# Patient Record
Sex: Female | Born: 1960 | Race: White | Hispanic: No | Marital: Married | Smoking: Current every day smoker
Health system: Southern US, Community
[De-identification: ages and names within clinical notes are randomized; demographics above are authoritative.]

## PROBLEM LIST (undated history)

## (undated) DIAGNOSIS — J449 Chronic obstructive pulmonary disease, unspecified: Secondary | ICD-10-CM

## (undated) HISTORY — PX: HERNIA REPAIR: SHX51

---

## 2001-09-21 ENCOUNTER — Other Ambulatory Visit: Admission: RE | Admit: 2001-09-21 | Discharge: 2001-09-21 | Payer: Self-pay | Admitting: Obstetrics and Gynecology

## 2004-08-01 ENCOUNTER — Ambulatory Visit (HOSPITAL_BASED_OUTPATIENT_CLINIC_OR_DEPARTMENT_OTHER): Admission: RE | Admit: 2004-08-01 | Discharge: 2004-08-01 | Payer: Self-pay | Admitting: Surgery

## 2004-10-24 ENCOUNTER — Ambulatory Visit: Payer: Self-pay

## 2006-01-15 ENCOUNTER — Emergency Department: Payer: Self-pay | Admitting: Emergency Medicine

## 2008-03-10 ENCOUNTER — Ambulatory Visit: Payer: Self-pay

## 2012-08-05 ENCOUNTER — Ambulatory Visit: Payer: Self-pay | Admitting: Family Medicine

## 2014-04-19 IMAGING — MG MM CAD SCREENING MAMMO
1 series · 4 of 4 positions shown · non-contrast
Comparison: none

REASON FOR EXAM: SCR MAMMO NO ORDER
COMMENTS:

PROCEDURE:     MAM - MAM DGTL SCRN MAM NO ORDER W/CAD  - August 05, 2012  [DATE]
RESULT:     COMPARISON:  03/10/2008, 10/24/2004
TECHNIQUE: Digital screening mammograms were obtained. FDA approved
computer-aided detection (CAD) for mammography was utilized for this study.
BREAST COMPOSITION: The breast composition is HETEROGENEOUSLY DENSE
(glandular tissue is 51-75%). This may decrease the sensitivity of
mammography.

[R CC · right · 4 of 4 slices shown]
[im 1/4]
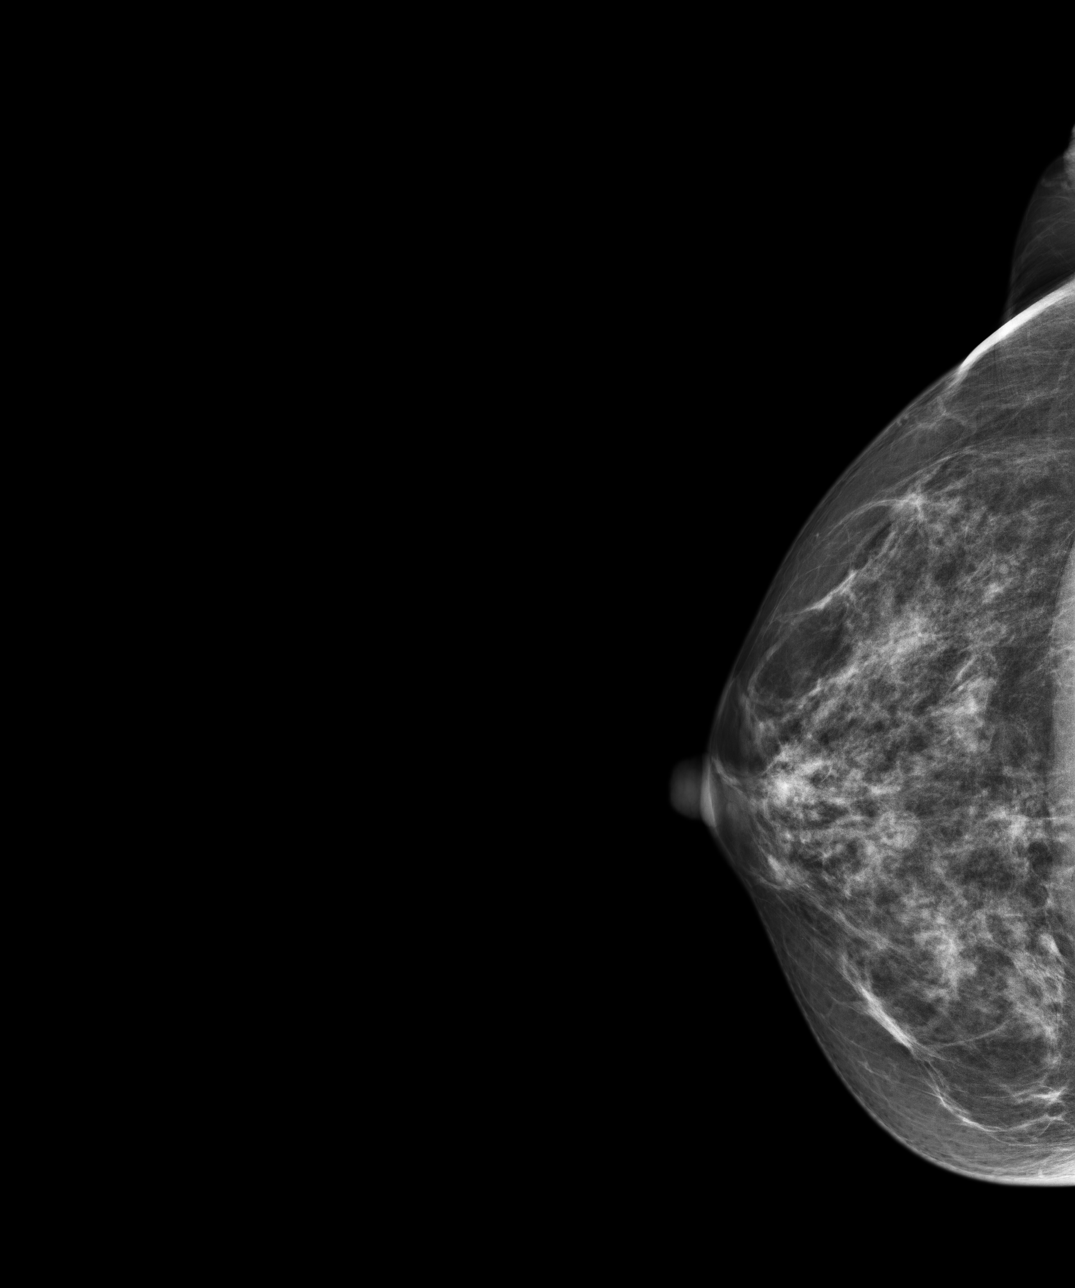
[im 2/4]
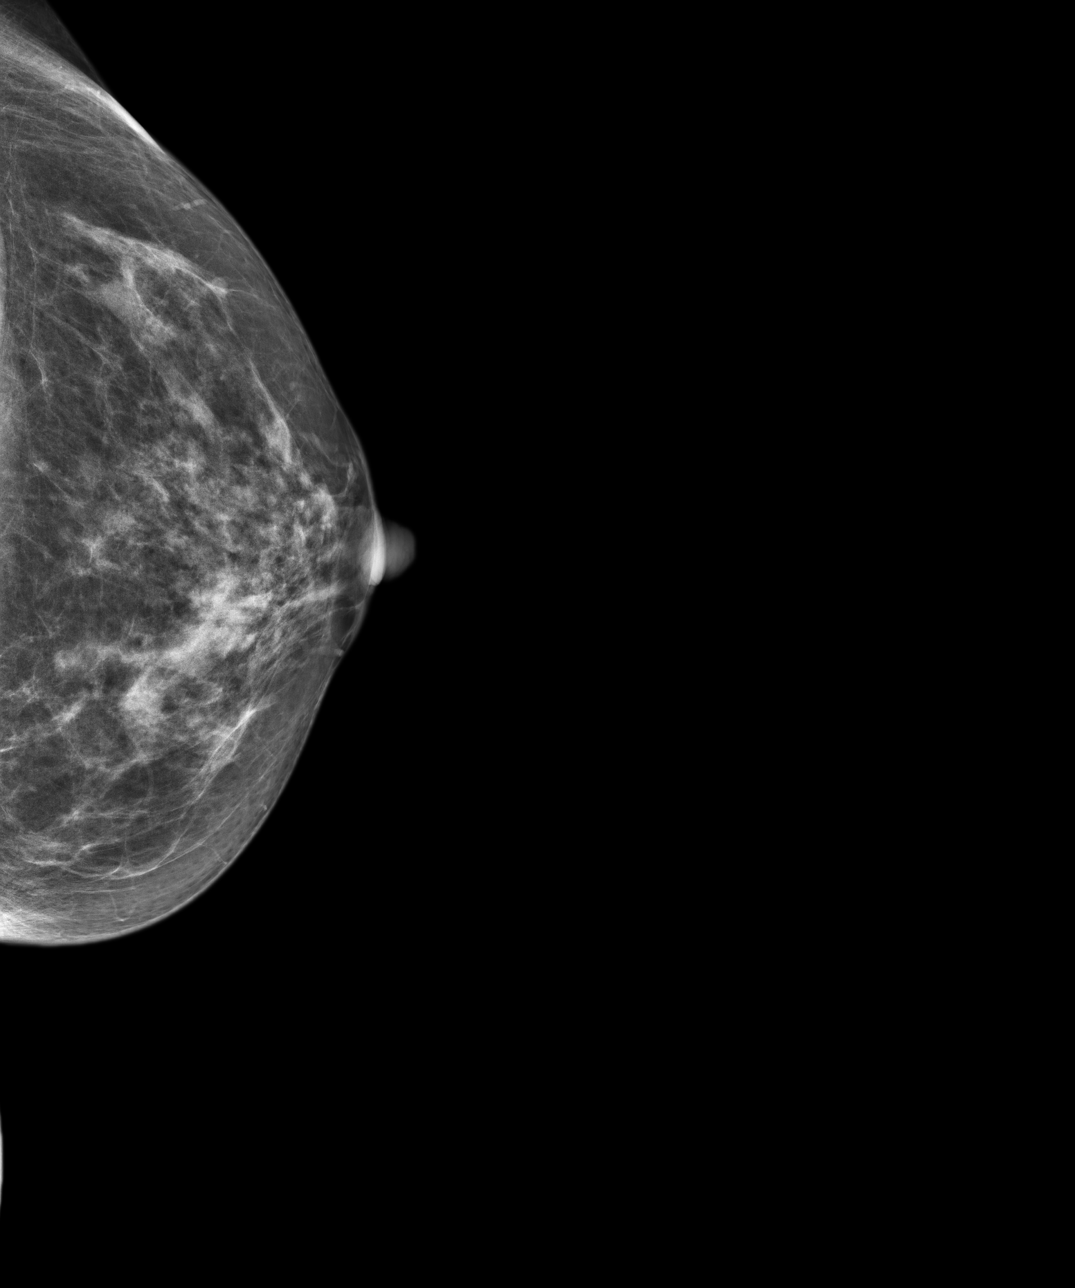
[im 3/4]
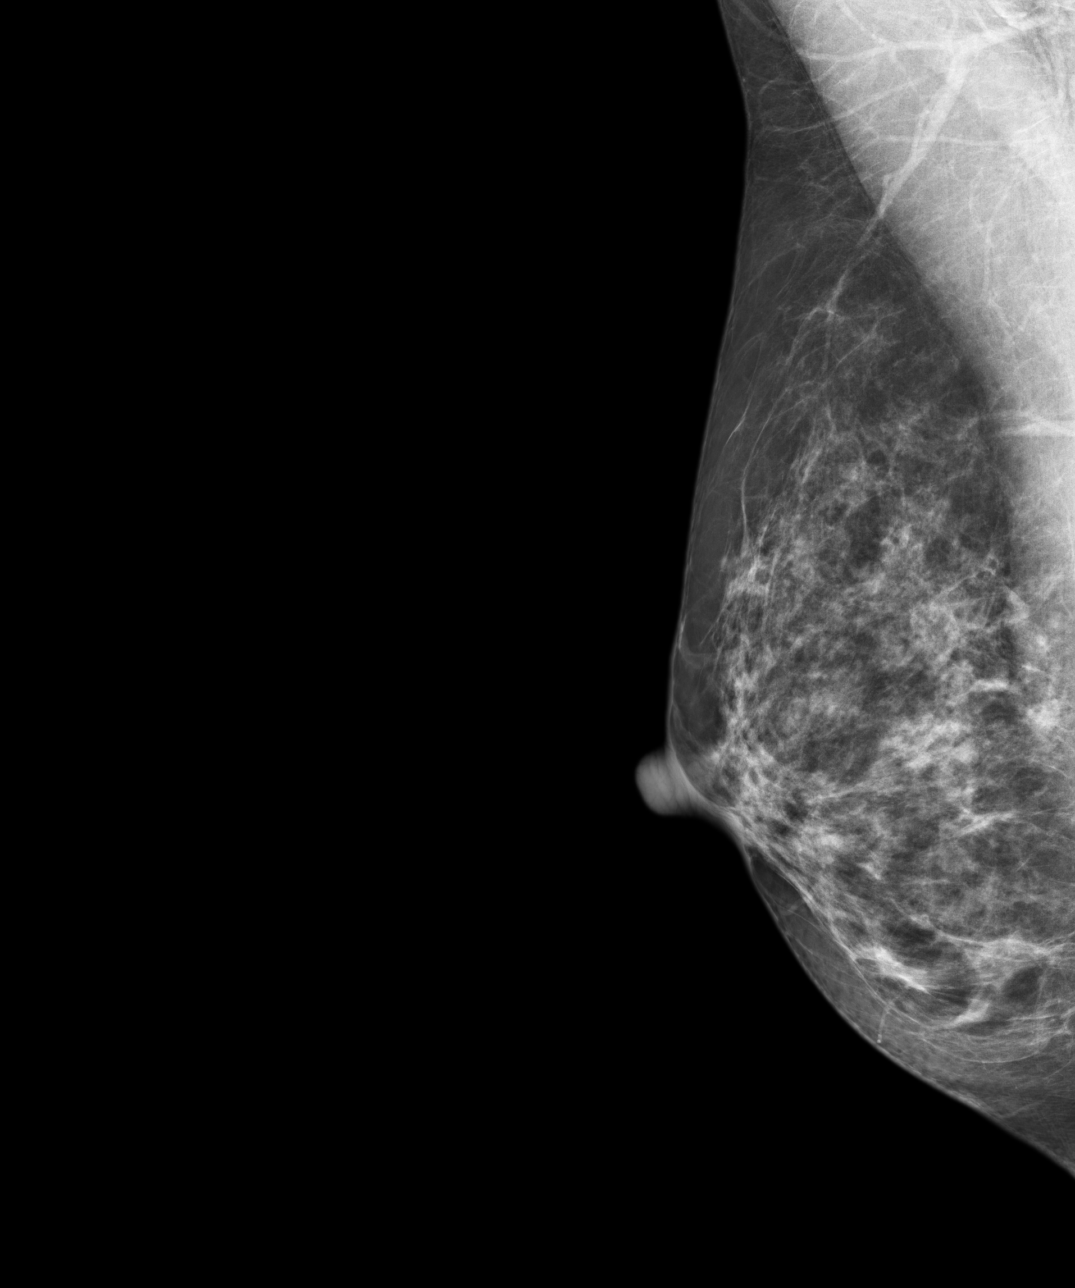
[im 4/4]
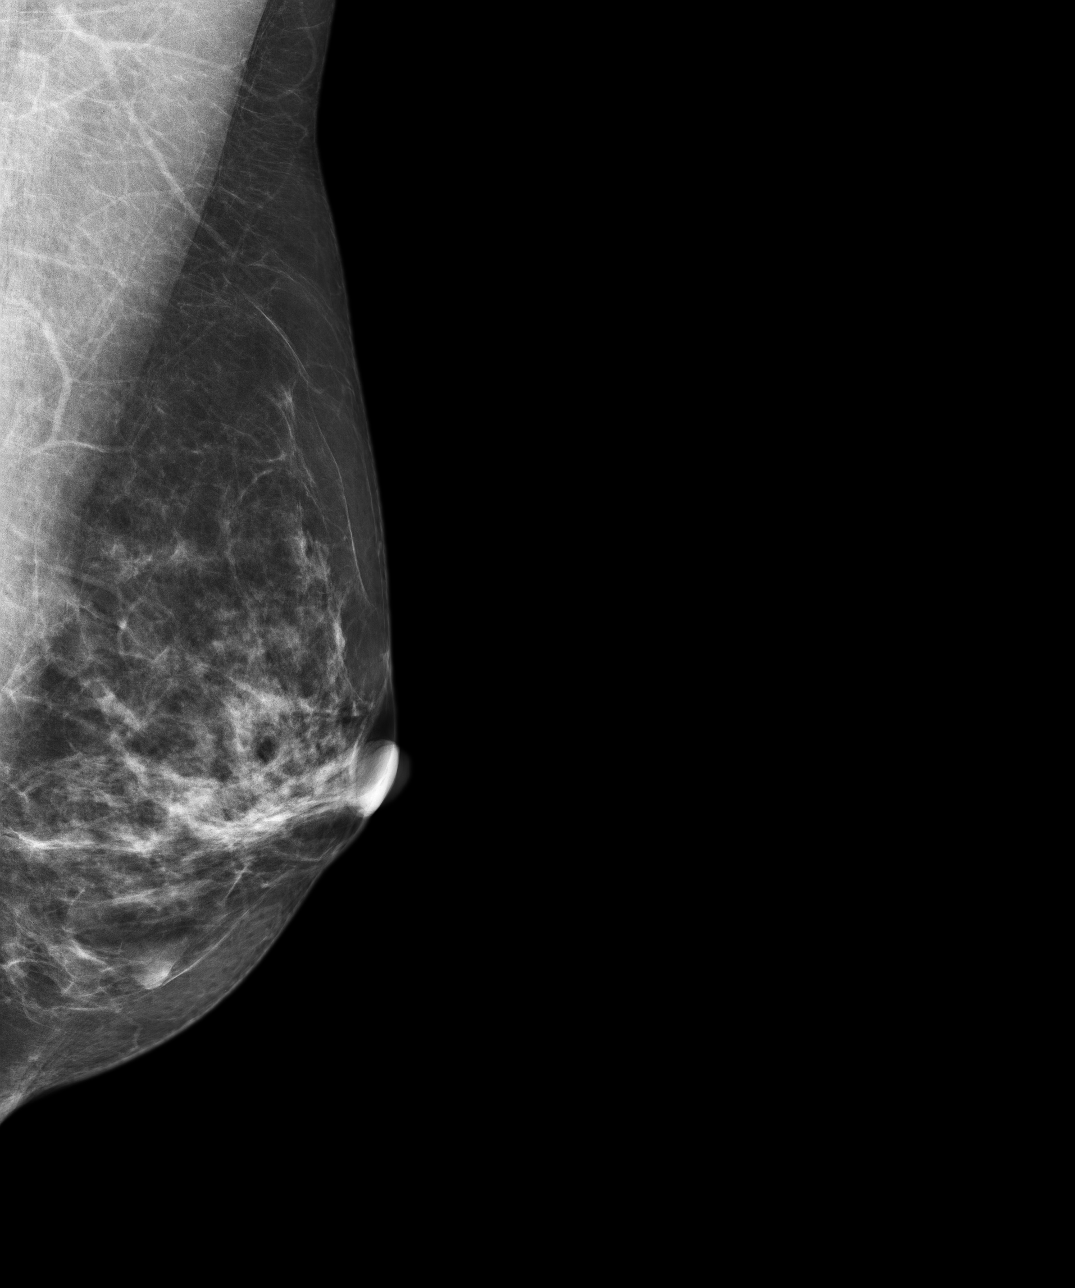

[4 of 4 positions shown; findings below may reference images not displayed]

FINDING: There is no dominant mass, architectural distortion or clusters of
suspicious microcalcifications.
IMPRESSION: 1.     Stable bilateral mammogram.
2.     Annual mammographic follow up recommended.

BI-RADS:  Category 2- Benign.

A negative mammogram report does not preclude biopsy or other evaluation of
a clinically palpable or otherwise suspicious mass or lesion. Breast cancer
may not be detected by mammography in up to 10% of cases.

[REDACTED]

## 2014-06-14 ENCOUNTER — Encounter: Payer: PRIVATE HEALTH INSURANCE | Admitting: Neurology

## 2018-05-10 ENCOUNTER — Encounter: Payer: Self-pay | Admitting: Emergency Medicine

## 2018-05-10 ENCOUNTER — Emergency Department: Payer: 59

## 2018-05-10 ENCOUNTER — Other Ambulatory Visit: Payer: Self-pay

## 2018-05-10 ENCOUNTER — Emergency Department
Admission: EM | Admit: 2018-05-10 | Discharge: 2018-05-10 | Disposition: A | Discharge status: Home / Self Care | Payer: 59 | Attending: Emergency Medicine | Admitting: Emergency Medicine

## 2018-05-10 DIAGNOSIS — J441 Chronic obstructive pulmonary disease with (acute) exacerbation: Secondary | ICD-10-CM | POA: Diagnosis not present

## 2018-05-10 DIAGNOSIS — R079 Chest pain, unspecified: Secondary | ICD-10-CM | POA: Diagnosis not present

## 2018-05-10 DIAGNOSIS — J40 Bronchitis, not specified as acute or chronic: Secondary | ICD-10-CM | POA: Insufficient documentation

## 2018-05-10 DIAGNOSIS — F17210 Nicotine dependence, cigarettes, uncomplicated: Secondary | ICD-10-CM | POA: Insufficient documentation

## 2018-05-10 DIAGNOSIS — R0602 Shortness of breath: Secondary | ICD-10-CM | POA: Diagnosis present

## 2018-05-10 HISTORY — DX: Chronic obstructive pulmonary disease, unspecified: J44.9

## 2018-05-10 LAB — BASIC METABOLIC PANEL
Anion gap: 11 (ref 5–15)
BUN: 10 mg/dL (ref 6–20)
CALCIUM: 9.7 mg/dL (ref 8.9–10.3)
CO2: 24 mmol/L (ref 22–32)
Chloride: 105 mmol/L (ref 98–111)
Creatinine, Ser: 0.9 mg/dL (ref 0.44–1.00)
Glucose, Bld: 122 mg/dL — ABNORMAL HIGH (ref 70–99)
Potassium: 3.7 mmol/L (ref 3.5–5.1)
SODIUM: 140 mmol/L (ref 135–145)

## 2018-05-10 LAB — CBC
HCT: 43.9 % (ref 35.0–47.0)
Hemoglobin: 15.5 g/dL (ref 12.0–16.0)
MCH: 34 pg (ref 26.0–34.0)
MCHC: 35.3 g/dL (ref 32.0–36.0)
MCV: 96.2 fL (ref 80.0–100.0)
PLATELETS: 200 10*3/uL (ref 150–440)
RBC: 4.57 MIL/uL (ref 3.80–5.20)
RDW: 12.9 % (ref 11.5–14.5)
WBC: 8.8 10*3/uL (ref 3.6–11.0)

## 2018-05-10 LAB — TROPONIN I

## 2018-05-10 MED ORDER — IPRATROPIUM-ALBUTEROL 0.5-2.5 (3) MG/3ML IN SOLN
3.0000 mL | Freq: Once | RESPIRATORY_TRACT | Status: AC
  Administered 2018-05-10: 3 mL via RESPIRATORY_TRACT
  Filled 2018-05-10: qty 3

## 2018-05-10 MED ORDER — PREDNISONE 20 MG PO TABS: 40.0000 mg | ORAL_TABLET | Freq: Every day | ORAL | 0 refills | Status: AC

## 2018-05-10 MED ORDER — PREDNISONE 20 MG PO TABS
60.0000 mg | ORAL_TABLET | Freq: Once | ORAL | Status: AC
  Administered 2018-05-10: 60 mg via ORAL
  Filled 2018-05-10: qty 3

## 2018-05-10 MED ORDER — ALBUTEROL SULFATE HFA 108 (90 BASE) MCG/ACT IN AERS: 2.0000 | INHALATION_SPRAY | Freq: Four times a day (QID) | RESPIRATORY_TRACT | 2 refills | Status: AC | PRN

## 2018-05-10 MED ORDER — DOXYCYCLINE HYCLATE 100 MG PO TABS: 100.0000 mg | ORAL_TABLET | Freq: Two times a day (BID) | ORAL | 0 refills | Status: AC

## 2018-05-10 NOTE — ED Triage Notes (Signed)
Pt arrives POV and ambulatory with steady gait to triage with c/o chest pain and "I can't breath". Pt talking in complete sentences at this time. Pt is in NAD.

## 2018-05-10 NOTE — Discharge Instructions (Addendum)
Please take your medications as prescribed.  Return to the emergency department for any worsening trouble breathing or chest pain, or any other symptom personally concerning to yourself.

## 2018-05-10 NOTE — ED Provider Notes (Signed)
[facility] Emergency Department Provider Note  Time seen: 5:14 AM  I have reviewed the triage vital signs and the nursing notes.   HISTORY  Chief Complaint Chest Pain    HPI Sara Castaneda is a 57 y.o. female with a past medical history of COPD presents to the emergency department with about 1 week of shortness of breath worse over the past 3 days.  Patient states over the past 1 week she has had increased cough and shortness of breath but has been significantly worse over the past 3 days.  Denies any fever.  States chest tightness/pressure at times.  No abdominal pain, nausea vomiting or diarrhea, largely negative review of systems otherwise.  Patient has a history of COPD and is currently a daily smoker.   Past Medical History:  Diagnosis Date  . COPD (chronic obstructive pulmonary disease) (HCC)     There are no active problems to display for this patient.   Past Surgical History:  Procedure Laterality Date  . CESAREAN SECTION    . HERNIA REPAIR      Prior to Admission medications   Not on File    Allergies  Allergen Reactions  . Erythromycin Hives    No family history on file.  Social History Social History   Tobacco Use  . Smoking status: Current Every Day Smoker    Packs/day: 0.50    Types: Cigarettes  . Smokeless tobacco: Never Used  Substance Use Topics  . Alcohol use: Yes  . Drug use: Never    Review of Systems Constitutional: Negative for fever. ENT: Negative for recent illness/congestion Cardiovascular: Intermittent chest tightness/pressure. Respiratory: Positive for shortness of breath.  Positive for cough with minimal sputum production. Gastrointestinal: Negative for abdominal pain, vomiting  Genitourinary: Negative for urinary compaints Musculoskeletal: Negative for leg pain or swelling Skin: Negative for skin complaints  Neurological: Negative for headache All other ROS  negative  ____________________________________________   PHYSICAL EXAM:  VITAL SIGNS: ED Triage Vitals  Enc Vitals Group     BP 05/10/18 0321 (!) 146/85     Pulse Rate 05/10/18 0321 (!) 120     Resp 05/10/18 0321 (!) 22     Temp 05/10/18 0321 98.1 F (36.7 C)     Temp Source 05/10/18 0321 Oral     SpO2 05/10/18 0321 94 %     Weight 05/10/18 0322 126 lb (57.2 kg)     Height 05/10/18 0322 5\' 6"  (1.676 m)     Head Circumference --      Peak Flow --      Pain Score 05/10/18 0315 10     Pain Loc --      Pain Edu? --      Excl. in GC? --     Constitutional: Alert and oriented. Well appearing and in no distress. Eyes: Normal exam ENT   Head: Normocephalic and atraumatic.   Mouth/Throat: Mucous membranes are moist. Cardiovascular: Normal rate, regular rhythm. No murmur Respiratory: Normal respiratory effort without tachypnea nor retractions. Breath sounds are clear  Gastrointestinal: Soft and nontender. No distention.   Musculoskeletal: Nontender with normal range of motion in all extremities. No lower extremity tenderness or edema. Neurologic:  Normal speech and language. No gross focal neurologic deficits are appreciated. Skin:  Skin is warm, dry and intact.  Psychiatric: Mood and affect are normal. Speech and behavior are normal.   ____________________________________________    EKG  EKG reviewed and interpreted by myself shows sinus tachycardia  114 bpm with a narrow QRS, normal axis, largely normal intervals with nonspecific ST changes.  No ST elevation.  ____________________________________________    RADIOLOGY  Chest x-ray shows bronchitis/COPD changes.  ____________________________________________   INITIAL IMPRESSION / ASSESSMENT AND PLAN / ED COURSE  Pertinent labs & imaging results that were available during my care of the patient were reviewed by me and considered in my medical decision making (see chart for details).  Patient presents to the  emergency department for several days of significant shortness of breath cough with minimal sputum production.  Differential would include COPD exacerbation, URI, bronchitis, pneumonia, ACS.  Patient's labs are resulted largely within normal limits including a negative troponin.  Chest x-ray shows COPD/bronchitis type changes.  EKG shows tachycardia but otherwise no acute abnormality.  Patient continues to have moderate expiratory wheeze bilaterally.  Received prednisone and a DuoNeb.  We will dose 2 additional breathing treatments in the emergency department and continue to closely monitor.  We will start the patient on Zithromax.  Patient agreeable to plan of care.  Patient states she is feeling better.  Lung sounds have improved after breathing treatments.  We will discharge on prednisone, albuterol and Zithromax.  Patient will follow-up with her doctor.  I discussed strict return precautions for any worsening trouble breathing or chest pain.  Patient agreeable to plan of care.  ____________________________________________   FINAL CLINICAL IMPRESSION(S) / ED DIAGNOSES  Bronchitis COPD exacerbation   Minna Antis, MD 05/10/18 [phone]

## 2018-10-08 ENCOUNTER — Other Ambulatory Visit: Payer: Self-pay

## 2018-10-08 ENCOUNTER — Emergency Department
Admission: EM | Admit: 2018-10-08 | Discharge: 2018-10-08 | Disposition: A | Discharge status: Home / Self Care | Payer: 59 | Attending: Emergency Medicine | Admitting: Emergency Medicine

## 2018-10-08 ENCOUNTER — Emergency Department: Payer: 59

## 2018-10-08 DIAGNOSIS — M25551 Pain in right hip: Secondary | ICD-10-CM

## 2018-10-08 DIAGNOSIS — M5416 Radiculopathy, lumbar region: Secondary | ICD-10-CM | POA: Insufficient documentation

## 2018-10-08 DIAGNOSIS — J449 Chronic obstructive pulmonary disease, unspecified: Secondary | ICD-10-CM | POA: Diagnosis not present

## 2018-10-08 DIAGNOSIS — F17210 Nicotine dependence, cigarettes, uncomplicated: Secondary | ICD-10-CM | POA: Diagnosis not present

## 2018-10-08 DIAGNOSIS — M549 Dorsalgia, unspecified: Secondary | ICD-10-CM | POA: Insufficient documentation

## 2018-10-08 LAB — URINALYSIS, COMPLETE (UACMP) WITH MICROSCOPIC
BILIRUBIN URINE: NEGATIVE
Glucose, UA: NEGATIVE mg/dL
Ketones, ur: NEGATIVE mg/dL
Leukocytes,Ua: NEGATIVE
NITRITE: NEGATIVE
PROTEIN: NEGATIVE mg/dL
Specific Gravity, Urine: 1.008 (ref 1.005–1.030)
pH: 6 (ref 5.0–8.0)

## 2018-10-08 MED ORDER — LIDOCAINE 5 % EX PTCH: 1.0000 | MEDICATED_PATCH | CUTANEOUS | 0 refills | Status: AC

## 2018-10-08 MED ORDER — KETOROLAC TROMETHAMINE 30 MG/ML IJ SOLN
30.0000 mg | Freq: Once | INTRAMUSCULAR | Status: AC
  Administered 2018-10-08: 30 mg via INTRAMUSCULAR
  Filled 2018-10-08: qty 1

## 2018-10-08 MED ORDER — LIDOCAINE 5 % EX PTCH
1.0000 | MEDICATED_PATCH | CUTANEOUS | Status: DC
  Administered 2018-10-08: 1 via TRANSDERMAL
  Filled 2018-10-08: qty 1

## 2018-10-08 MED ORDER — KETOROLAC TROMETHAMINE 10 MG PO TABS: 10.0000 mg | ORAL_TABLET | Freq: Four times a day (QID) | ORAL | 0 refills | Status: AC | PRN

## 2018-10-08 MED ORDER — CYCLOBENZAPRINE HCL 5 MG PO TABS: ORAL_TABLET | ORAL | 0 refills | Status: AC

## 2018-10-08 NOTE — ED Triage Notes (Signed)
Pt c/o right hip with lower back and leg pain for the past month, states she does a lot of heavy lifting.

## 2018-10-08 NOTE — ED Notes (Signed)
Pt verbalized understanding of discharge instructions. NAD at this time. 

## 2018-10-08 NOTE — ED Provider Notes (Signed)
[facility] Emergency Department Provider Note  ____________________________________________  Time seen: Approximately 10:26 AM  I have reviewed the triage vital signs and the nursing notes.   HISTORY  Chief Complaint Back Pain    HPI Sara Castaneda is a 58 y.o. female that presents to the emergency department for evaluation of right hip pain that radiates into right leg for 1 month.  Pain is currently primarily to right hip.  Occasionally she will have pain to her right back.  Sometimes the pain will radiate into her right shin.  Patient states that she went to primary care several weeks ago, had x-rays, which were negative and received steroid injections and a prescription for prednisone.  Symptoms did not improve.  She went to the chiropractor yesterday, who repeated x-rays.  Chiropractor told her that she may have a kidney stone that she saw on x-rays but otherwise x-rays were negative.  She did have one episode last week where she had urinary urgency.  No bowel or bladder dysfunction or saddle anesthesias.  Leg does not feel weak.  No trauma.  She does heavy lifting for work.  She had a urinalysis in December and was told there was blood and protein in her urine.  She states that she is frustrated because she feels like she is being bounced around and not given any answers. No fever, dysuria.  Past Medical History:  Diagnosis Date  . COPD (chronic obstructive pulmonary disease) (HCC)     There are no active problems to display for this patient.   Past Surgical History:  Procedure Laterality Date  . CESAREAN SECTION    . HERNIA REPAIR      Prior to Admission medications   Medication Sig Start Date End Date Taking? Authorizing Provider  albuterol (PROVENTIL HFA;VENTOLIN HFA) 108 (90 Base) MCG/ACT inhaler Inhale 2 puffs into the lungs every 6 (six) hours as needed for wheezing or shortness of breath. 05/10/18   Minna Antis, MD  cyclobenzaprine  (FLEXERIL) 5 MG tablet Take 1-2 tablets 3 times daily as needed 10/08/18   Enid Derry, PA-C  doxycycline (VIBRA-TABS) 100 MG tablet Take 1 tablet (100 mg total) by mouth 2 (two) times daily. 05/10/18   Minna Antis, MD  ketorolac (TORADOL) 10 MG tablet Take 1 tablet (10 mg total) by mouth every 6 (six) hours as needed. 10/08/18   Enid Derry, PA-C  lidocaine (LIDODERM) 5 % Place 1 patch onto the skin daily. Remove & Discard patch within 12 hours or as directed by MD 10/08/18   Enid Derry, PA-C  predniSONE (DELTASONE) 20 MG tablet Take 2 tablets (40 mg total) by mouth daily. 05/10/18   Minna Antis, MD    Allergies Erythromycin  No family history on file.  Social History Social History   Tobacco Use  . Smoking status: Current Every Day Smoker    Packs/day: 0.50    Types: Cigarettes  . Smokeless tobacco: Never Used  Substance Use Topics  . Alcohol use: Yes  . Drug use: Never     Review of Systems  Constitutional: No fever/chills Cardiovascular: No chest pain. Respiratory: No SOB. Gastrointestinal: No abdominal pain.  No nausea, no vomiting.  Musculoskeletal: Positive for back pain and hip pain. Skin: Negative for rash, abrasions, lacerations, ecchymosis. Neurological: Negative for headaches, numbness or tingling   ____________________________________________   PHYSICAL EXAM:  VITAL SIGNS: ED Triage Vitals  Enc Vitals Group     BP 10/08/18 0927 128/74     Pulse  Rate 10/08/18 0927 92     Resp 10/08/18 0927 18     Temp 10/08/18 0927 97.6 F (36.4 C)     Temp Source 10/08/18 0927 Oral     SpO2 10/08/18 0927 97 %     Weight 10/08/18 0927 138 lb (62.6 kg)     Height 10/08/18 0927 5\' 6"  (1.676 m)     Head Circumference --      Peak Flow --      Pain Score 10/08/18 0930 7     Pain Loc --      Pain Edu? --      Excl. in GC? --      Constitutional: Alert and oriented. Well appearing and in no acute distress. Eyes: Conjunctivae are normal. PERRL.  EOMI. Head: Atraumatic. ENT:      Ears:      Nose: No congestion/rhinnorhea.      Mouth/Throat: Mucous membranes are moist.  Neck: No stridor.  Cardiovascular: Normal rate, regular rhythm.  Good peripheral circulation. Respiratory: Normal respiratory effort without tachypnea or retractions. Lungs CTAB. Good air entry to the bases with no decreased or absent breath sounds. Gastrointestinal: Bowel sounds 4 quadrants. Soft and nontender to palpation. No guarding or rigidity. No palpable masses. No distention.  Musculoskeletal: Full range of motion to all extremities. No gross deformities appreciated.  There is no tenderness to palpation of the lumbar spine or lumbar paraspinal muscles.  No ecchymosis or edema.  Tenderness to palpation to right trochanteric bursa.  Full range of motion of hip with normal flexion, extension, external and internal rotation of hip.  Strength equal in lower extremities bilaterally.  Negative straight leg raise.  Normal gait.  No foot drop. Neurologic:  Normal speech and language. No gross focal neurologic deficits are appreciated.  Skin:  Skin is warm, dry and intact. No rash noted. Psychiatric: Mood and affect are normal. Speech and behavior are normal. Patient exhibits appropriate insight and judgement.   ____________________________________________   LABS (all labs ordered are listed, but only abnormal results are displayed)  Labs Reviewed  URINALYSIS, COMPLETE (UACMP) WITH MICROSCOPIC - Abnormal; Notable for the following components:      Result Value   Color, Urine STRAW (*)    APPearance CLEAR (*)    Hgb urine dipstick MODERATE (*)    Bacteria, UA RARE (*)    All other components within normal limits  URINE CULTURE   ____________________________________________  EKG   ____________________________________________  RADIOLOGY  Ct L-spine No Charge  Result Date: 10/08/2018 CLINICAL DATA:  Low back and right hip pain for the past month. No known  injury. EXAM: CT LUMBAR SPINE WITHOUT CONTRAST TECHNIQUE: Multidetector CT imaging of the lumbar spine was performed without intravenous contrast administration. Multiplanar CT image reconstructions were also generated. COMPARISON:  None. FINDINGS: Segmentation: Standard. Alignment: Normal. Vertebrae: No fracture or focal lesion. Paraspinal and other soft tissues: See report of dedicated abdomen and pelvis CT scan this same day. Disc levels: T12-L1: Negative. L1-2: Mild loss of disc space height and facet arthropathy. No stenosis. L2-3: Negative. L3-4: Negative. L4-5: There is a shallow disc bulge, mild to moderate facet degenerative change and some ligamentum flavum thickening. Mild central canal narrowing is seen. The foramina are open. L5-S1: Facet degenerative change is worse on the left. Minimal disc bulge. No stenosis. IMPRESSION: No acute abnormality. 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 Electronically Signed   By:  Drusilla Kanner M.D.   On: 10/08/2018 12:21   Ct Renal Stone Study  Result Date: 10/08/2018 CLINICAL DATA:  Hematuria EXAM: CT ABDOMEN AND PELVIS WITHOUT CONTRAST TECHNIQUE: Multidetector CT imaging of the abdomen and pelvis was performed following the standard protocol without oral or IV contrast. COMPARISON:  None. FINDINGS: Lower chest: Lung bases are clear. Hepatobiliary: There is a 5 mm cyst in the dome of the liver on the right anteriorly. There is an area of decreased attenuation in the left lobe of the liver measuring 1 x 1 cm of uncertain etiology on this noncontrast enhanced study. It may well represent a small hemangioma. Gallbladder wall is not appreciably thickened. There is no biliary duct dilatation. Pancreas: No pancreatic mass or inflammatory focus. Spleen: No splenic lesions are evident beyond occasional small calcified granulomas. A small splenule is noted medially to the spleen.  Adrenals/Urinary Tract: Adrenals bilaterally appear normal. There is no evident renal mass or hydronephrosis on either side. There is moderate renal sinus fat bilaterally, a finding of questionable significance. There is no appreciable renal or ureteral calculus on either side. Urinary bladder is midline with wall thickness within normal limits. Stomach/Bowel: There is no appreciable bowel wall or mesenteric thickening. There is no evident bowel obstruction. No free air or portal venous air. Vascular/Lymphatic: There are foci of aortic atherosclerosis. No abdominal aortic aneurysm. There is no appreciable adenopathy in the abdomen or pelvis. Reproductive: Uterus is anteverted.  No evident pelvic mass. Other: Appendix appears normal. No abscess or ascites in the abdomen or pelvis. There is a fairly small left inguinal hernia containing only fat. Musculoskeletal: No blastic or lytic bone lesions are evident. No intramuscular lesions are evident. IMPRESSION: 1. A cause for hematuria has not been established with this study. There is no evident renal or ureteral calculus. No hydronephrosis. Urinary bladder wall thickness is normal. 2. No bowel obstruction. No abscess or ascites in the abdomen or pelvis. Appendix appears normal. 3.  Small left inguinal hernia containing only fat. 4.  Small calcified granulomas in the spleen. 5. 1 cm suspected hemangioma left lobe of the liver. If further evaluation of this area is felt to be warranted, nonemergent CT or MR pre and serial post-contrast would be advised to further evaluate. 6.  Aortic atherosclerosis. Electronically Signed   By: Bretta Bang III M.D.   On: 10/08/2018 12:26    ____________________________________________    PROCEDURES  Procedure(s) performed:    Procedures    Medications  lidocaine (LIDODERM) 5 % 1 patch (1 patch Transdermal Patch Applied 10/08/18 1317)  ketorolac (TORADOL) 30 MG/ML injection 30 mg (30 mg Intramuscular Given 10/08/18  1317)     ____________________________________________   INITIAL IMPRESSION / ASSESSMENT AND PLAN / ED COURSE  Pertinent labs & imaging results that were available during my care of the patient were reviewed by me and considered in my medical decision making (see chart for details).  Review of the [place] CSRS was performed in accordance of the [facility] prior to dispensing any controlled drugs.     Patient presented to the emergency department for evaluation of low back pain and right hip pain for 1 month.  Vital signs and exam are reassuring.  Urinalysis shows rare bacteria, likely contamination as patient does not have any urinary symptoms and and hemoglobin.  Urine will be sent for culture.  CT abdomen and lumbar results were discussed with patient and she was given a copy to take home with her and to her  primary care provider.  Symptoms are consistent with bursitis and lumbar radiculopathy.  She has already tried a course of prednisone.  She was given a shot of Toradol in the emergency department.  Patient will be discharged home with prescriptions for Flexeril and Toradol. Patient is to follow up with primary care, orthopedics, neurosurgery as directed. Patient is given ED precautions to return to the ED for any worsening or new symptoms.     ____________________________________________  FINAL CLINICAL IMPRESSION(S) / ED DIAGNOSES  Final diagnoses:  Back pain  Pain of right hip joint  Lumbar radiculopathy      NEW MEDICATIONS STARTED DURING THIS VISIT:  ED Discharge Orders         Ordered    ketorolac (TORADOL) 10 MG tablet  Every 6 hours PRN     10/08/18 1300    cyclobenzaprine (FLEXERIL) 5 MG tablet     10/08/18 1300    lidocaine (LIDODERM) 5 %  Every 24 hours     10/08/18 1300              This chart was dictated using voice recognition software/Dragon. Despite best efforts to proofread, errors can occur which can change the meaning. Any change was purely  unintentional.    Enid Derry, PA-C 10/08/18 1512    Arnaldo Natal, MD 10/08/18 [phone]

## 2018-10-09 LAB — URINE CULTURE: CULTURE: NO GROWTH

## 2020-05-30 ENCOUNTER — Other Ambulatory Visit: Payer: Self-pay | Admitting: Internal Medicine

## 2020-05-30 DIAGNOSIS — R928 Other abnormal and inconclusive findings on diagnostic imaging of breast: Secondary | ICD-10-CM

## 2020-06-21 IMAGING — CT CT RENAL STONE PROTOCOL
2 of 4 series · 16 of 46 positions shown, 18 images · non-contrast
Comparison: None.

CLINICAL DATA: Hematuria

EXAM:
CT ABDOMEN AND PELVIS WITHOUT CONTRAST
TECHNIQUE: Multidetector CT imaging of the abdomen and pelvis was performed
following the standard protocol without oral or IV contrast.

[Series 2: stone full standard · axial · 0.68mm/px · z∈[-1125,-710]mm · 13 of 91 slices shown, 15 images]
[im 4/91  soft-tissue]
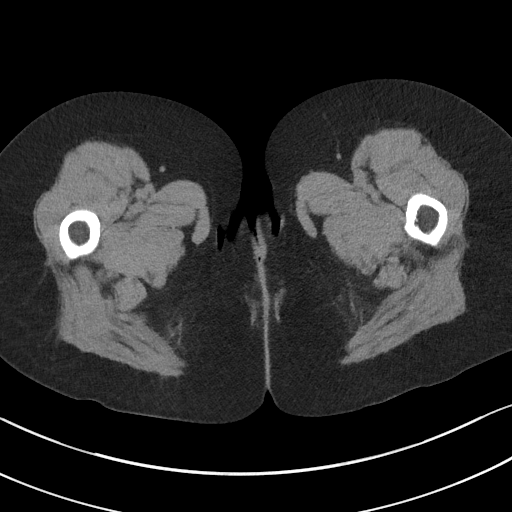
[im 4/91  bone]
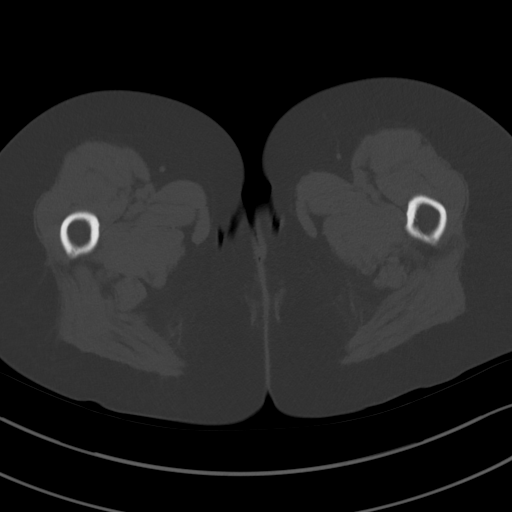
[im 11/91  soft-tissue]
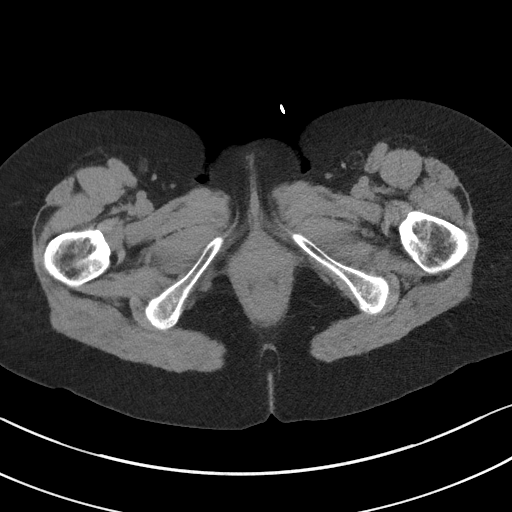
[im 19/91  soft-tissue]
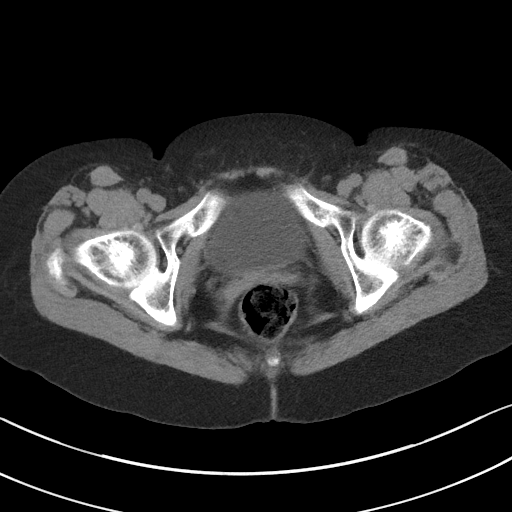
[im 26/91  soft-tissue]
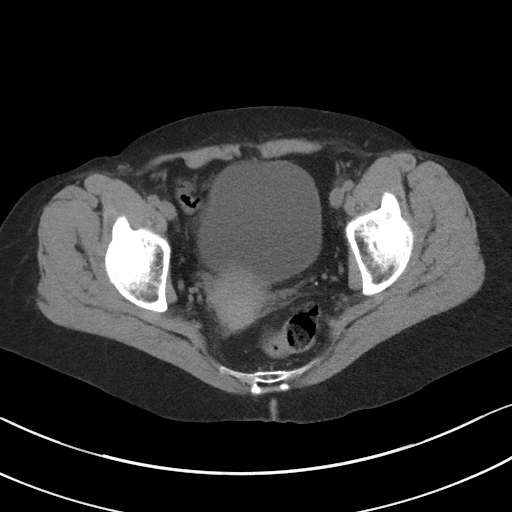
[im 33/91  soft-tissue]
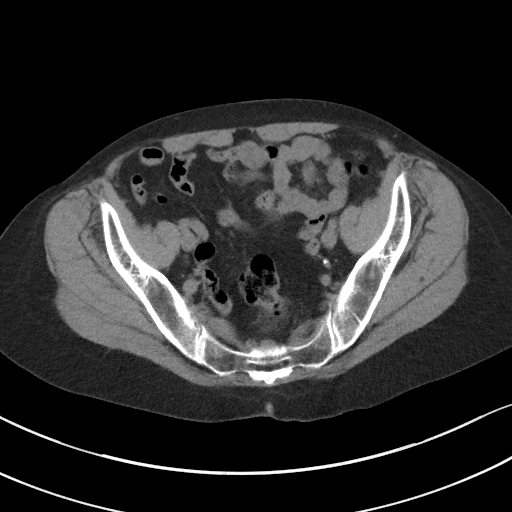
[im 40/91  soft-tissue]
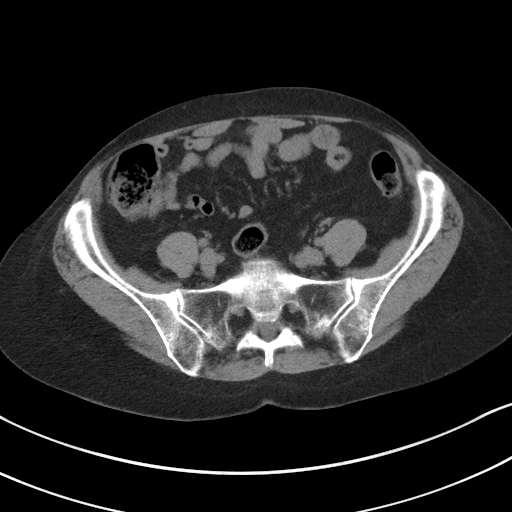
[im 47/91  soft-tissue]
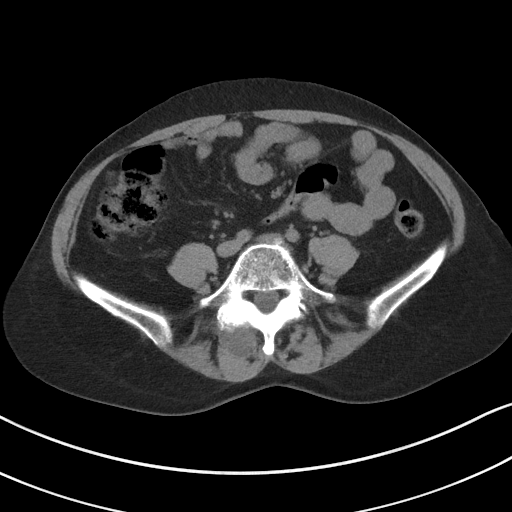
[im 51/91  soft-tissue]
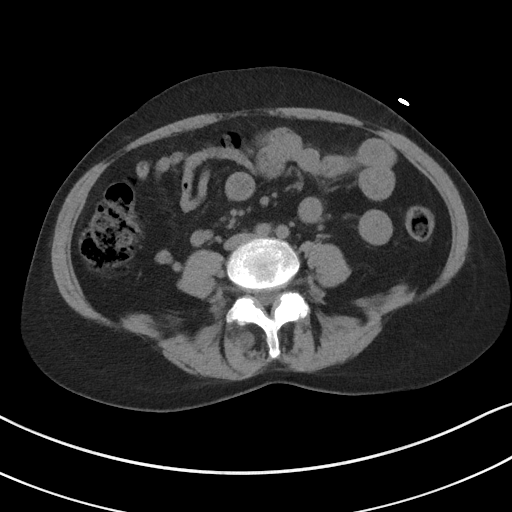
[im 58/91  soft-tissue]
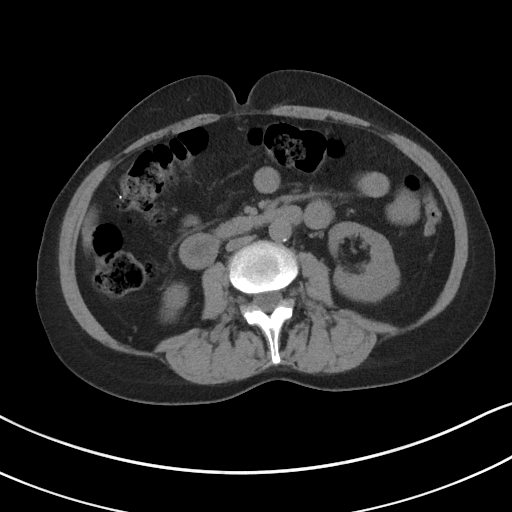
[im 58/91  bone]
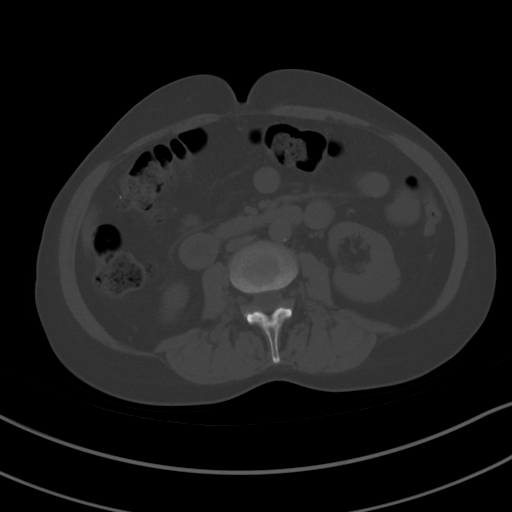
[im 65/91  soft-tissue]
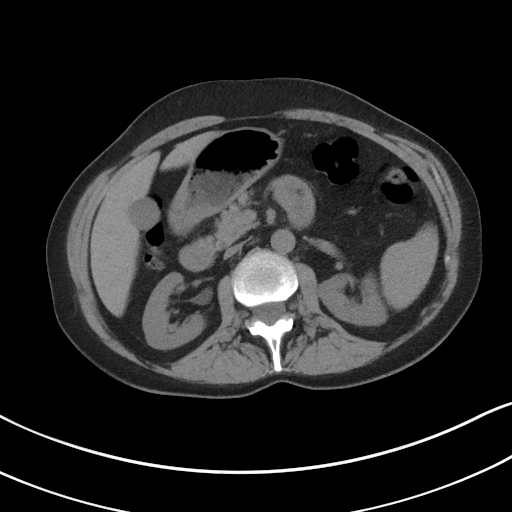
[im 73/91  soft-tissue]
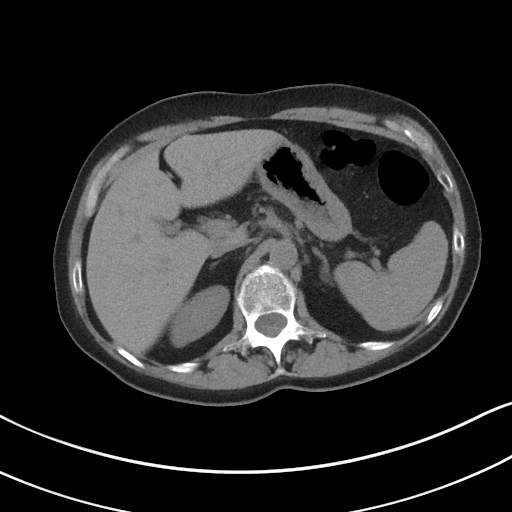
[im 80/91  soft-tissue]
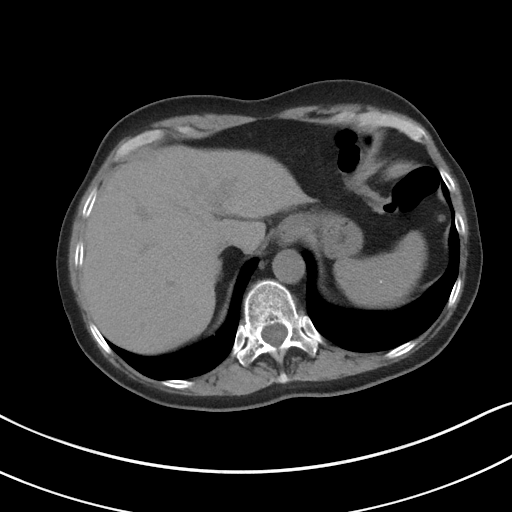
[im 87/91  soft-tissue]
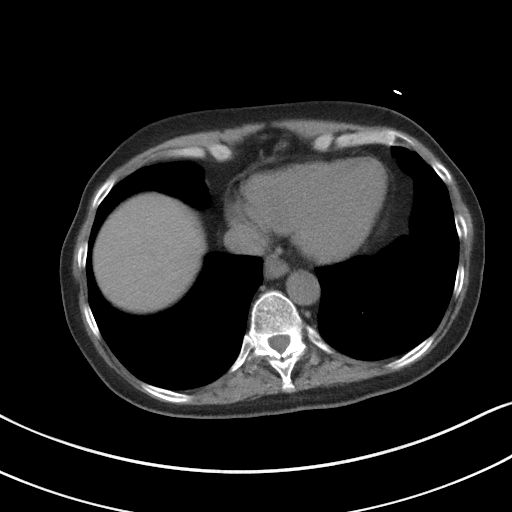

[Series 5: coronal · coronal · 0.80mm/px · 3 of 119 slices shown]
[im 40/119  soft-tissue]
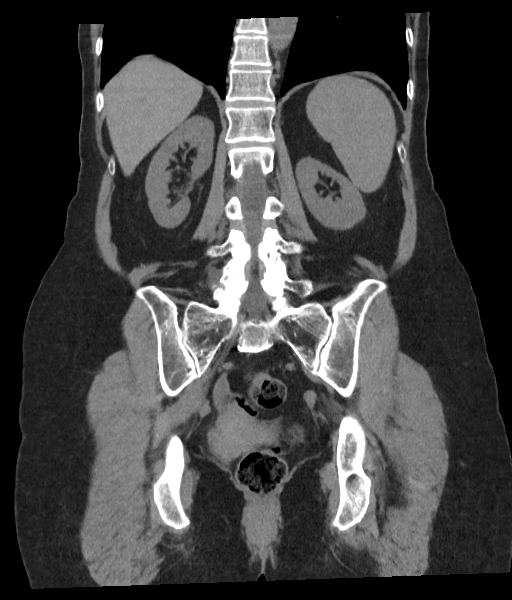
[im 53/119  soft-tissue]
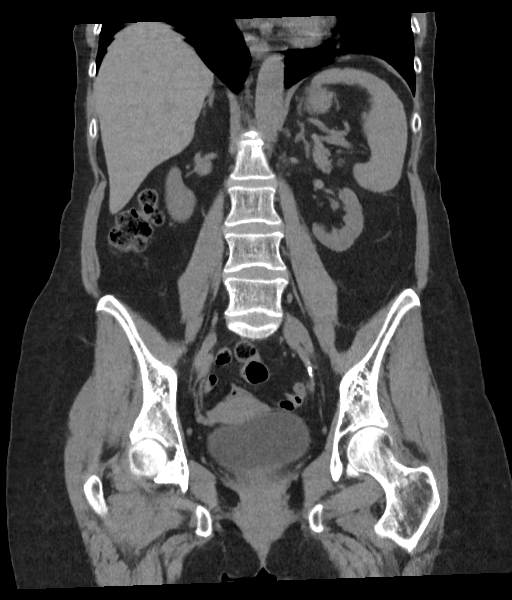
[im 66/119  soft-tissue]
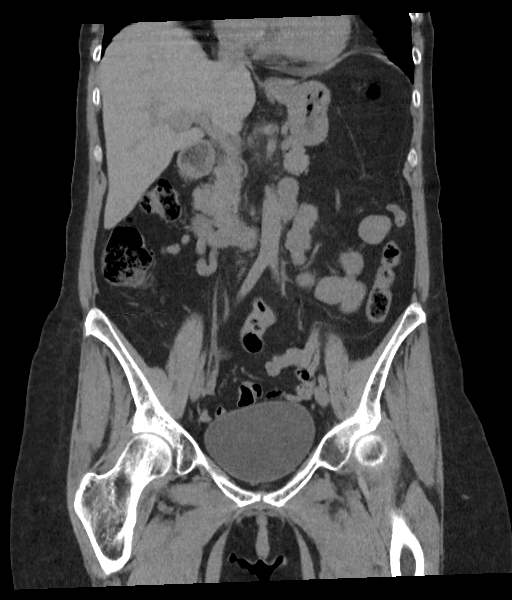

[16 of 46 positions shown; findings below may reference images not displayed]

FINDINGS: Lower chest: Lung bases are clear.

Hepatobiliary: There is a 5 mm cyst in the dome of the liver on the
right anteriorly. There is an area of decreased attenuation in the
left lobe of the liver measuring 1 x 1 cm of uncertain etiology on
this noncontrast enhanced study. It may well represent a small
hemangioma. Gallbladder wall is not appreciably thickened. There is
no biliary duct dilatation.

Pancreas: No pancreatic mass or inflammatory focus.

Spleen: No splenic lesions are evident beyond occasional small
calcified granulomas. A small splenule is noted medially to the
spleen.

Adrenals/Urinary Tract: Adrenals bilaterally appear normal. There is
no evident renal mass or hydronephrosis on either side. There is
moderate renal sinus fat bilaterally, a finding of questionable
significance. There is no appreciable renal or ureteral calculus on
either side. Urinary bladder is midline with wall thickness within
normal limits.

Stomach/Bowel: There is no appreciable bowel wall or mesenteric
thickening. There is no evident bowel obstruction. No free air or
portal venous air.

Vascular/Lymphatic: There are foci of aortic atherosclerosis. No
abdominal aortic aneurysm. There is no appreciable adenopathy in the
abdomen or pelvis.

Reproductive: Uterus is anteverted.  No evident pelvic mass.

Other: Appendix appears normal. No abscess or ascites in the abdomen
or pelvis. There is a fairly small left inguinal hernia containing
only fat.

Musculoskeletal: No blastic or lytic bone lesions are evident. No
intramuscular lesions are evident.
IMPRESSION: 1. A cause for hematuria has not been established with this study.
There is no evident renal or ureteral calculus. No hydronephrosis.
Urinary bladder wall thickness is normal.

2. No bowel obstruction. No abscess or ascites in the abdomen or
pelvis. Appendix appears normal.

3.  Small left inguinal hernia containing only fat.

4.  Small calcified granulomas in the spleen.

5. 1 cm suspected hemangioma left lobe of the liver. If further
evaluation of this area is felt to be warranted, nonemergent CT or
MR pre and serial post-contrast would be advised to further
evaluate.

6.  Aortic atherosclerosis.

## 2020-10-15 ENCOUNTER — Ambulatory Visit: Payer: No Typology Code available for payment source | Attending: Sports Medicine

## 2020-10-15 ENCOUNTER — Other Ambulatory Visit: Payer: Self-pay

## 2020-10-15 DIAGNOSIS — M25612 Stiffness of left shoulder, not elsewhere classified: Secondary | ICD-10-CM

## 2020-10-15 DIAGNOSIS — M25512 Pain in left shoulder: Secondary | ICD-10-CM | POA: Diagnosis present

## 2020-10-15 NOTE — Therapy (Signed)
[place] [facility] [address] [place], [address] Phone: [phone]   Fax:  [phone]  Physical Therapy Evaluation  Patient Details  Name: Sara Castaneda MRN: [id number] Date of Birth: Jul 13, 1961 Referring Provider (PT): felder MD   Encounter Date: 10/15/2020   PT End of Session - 10/15/20 1300    Visit Number 1    Number of Visits 17    Date for PT Re-Evaluation 01/07/21    Authorization Type WC    PT Start Time 1115    PT Stop Time 1200    PT Time Calculation (min) 45 min    Activity Tolerance Patient tolerated treatment well    Behavior During Therapy [facility] for tasks assessed/performed           Past Medical History:  Diagnosis Date  . COPD (chronic obstructive pulmonary disease) (HCC)     Past Surgical History:  Procedure Laterality Date  . CESAREAN SECTION    . HERNIA REPAIR      There were no vitals filed for this visit.    Subjective Assessment - 10/15/20 1126    Subjective Patient states humerus fx from fall on Feb 18th. Increased pain with leaning forward  and adjusting arm onto body, inccreased pain at night and trying to lift her arm overhead. Patient states she also has increased pain with prolonged sitting. Patient reports she has been having increased pain around 1 x night when falling asleep. Patient states she has also had increased pain along the affected elbow and is afraid of an elbow fx. Patient states she also has been having difficulty with straightening her elbow on the afftected side. Patient reports her shoulder hurts more throughout the day.    Pertinent History LBP: Prior LBP and upper back pain along hip pain    Limitations Lifting    Diagnostic tests X-Ray shoulder - closed fx    Patient Stated Goals regain use of UE    Currently in Pain? Yes    Pain Score 4    7/10 worst   Pain Location Shoulder    Pain Orientation Left    Pain Descriptors / Indicators Aching    Pain  Type Chronic pain    Pain Onset More than a month ago    Pain Frequency Constant              [facility] PT Assessment - 10/15/20 1554      Assessment   Medical Diagnosis L humerus fx    Referring Provider (PT) felder MD    Onset Date/Surgical Date 09/28/20    Hand Dominance Right    Next MD Visit unknown    Prior Therapy no      Balance Screen   Has the patient fallen in the past 6 months Yes    How many times? 1    Has the patient had a decrease in activity level because of a fear of falling?  Yes    Is the patient reluctant to leave their home because of a fear of falling?  No      Home Environment   Living Environment Private residence    Living Arrangements Spouse/significant other    Available Help at Discharge Family      Prior Function   Level of Independence Independent    Vocation Full time employment    Vocation Requirements Photo set up; lifting, walking    Leisure tennis  Cognition   Overall Cognitive Status Within Functional Limits for tasks assessed      Observation/Other Assessments   Observations Rests shoulder in adduction and IR      Sensation   Light Touch Appears Intact      ROM / Strength   AROM / PROM / Strength PROM      PROM   PROM Assessment Site Elbow;Shoulder    Right/Left Shoulder Right;Left    Right Shoulder Flexion 165 Degrees    Right Shoulder ABduction 165 Degrees    Right Shoulder Internal Rotation 80 Degrees    Right Shoulder External Rotation 80 Degrees    Left Shoulder Flexion 90 Degrees    Left Shoulder ABduction 90 Degrees    Left Shoulder Internal Rotation 90 Degrees    Left Shoulder External Rotation 10 Degrees    Right/Left Elbow Right;Left    Right Elbow Flexion 90    Right Elbow Extension 150    Left Elbow Flexion 120    Left Elbow Extension -10      Palpation   Palpation comment TTP alond delt, bicep, and wrist flexors           Ecchymosis noted along the medial aspect of the  forearm  TREATMENT Therapeutic Exercise Pendulum in standing -- 30 sec x 10 Elbow flexion -- x 10 Table slides in sitting -- x 10   Performed exercises to decrease pain and spasms  Objective measurements completed on examination: See above findings.               PT Education - 10/15/20 1259    Education Details HEP: table slides, elbow flexion/ext, pendulums; POC    Person(s) Educated Patient    Methods Explanation;Demonstration;Handout    Comprehension Verbalized understanding;Returned demonstration            PT Short Term Goals - 10/15/20 1544      PT SHORT TERM GOAL #1   Title Patient is will independent with low-level exercises to improve pain and decreased pain    Baseline dependent with HEP    Time 4    Period Weeks    Status New             PT Long Term Goals - 10/15/20 1546      PT LONG TERM GOAL #1   Title Patient will improve her AROM to >165 degrees of flexion and abduction to decrease pain and spasms in the L shoulder    Baseline PROM: 90    Time 6    Period Weeks    Status New      PT LONG TERM GOAL #2   Title Patient will improve be worst pain score from a 9/10 to a 4/10 to indicate significant improvement with pain and spasms along the shoulders.    Baseline 9/10    Time 6    Period Weeks    Status New      PT LONG TERM GOAL #3   Title Patient willl improve her FOTO score signficantly to indicate shoulder flexion improvment and allow for greater overhead movement.    Time 6    Period Weeks    Status New      PT LONG TERM GOAL #4   Title Patient will improve her shoulder ER/IR on the L side to decrease pain and spasms and allow for greater motion    Baseline shoulder ER/IR: 10/80    Time 6    Period Weeks  Status New                  Plan - 10/15/20 1301    Clinical Impression Statement Patient is a 60 yo right hand dominant female presenting with increased L shoulder pain s/p a closed fx from a fall on  09/28/2020. Patient demosntrates shoulder dysfunction secondary to the fx. Increased dysfunction noted from increased resting pain, decreased PROM and poor muscular activation. Patient's PROM flexion and abduction at 90 degrees, unable to initiate L shoulder motion without increase in pain. Patient will benefit from further skilled therapy to return to prior level of function.    Personal Factors and Comorbidities Comorbidity 2    Comorbidities Shoulder fx, LBP    Examination-Activity Limitations Lift;Carry    Examination-Participation Restrictions Cleaning;Occupation    Stability/Clinical Decision Making Stable/Uncomplicated    Clinical Decision Making Low    Rehab Potential Good    PT Frequency 2x / week    PT Duration 6 weeks    PT Treatment/Interventions Therapeutic exercise;Therapeutic activities;Electrical Stimulation;Iontophoresis 4mg /ml Dexamethasone;Neuromuscular re-education;Moist Heat;Stair training;Cryotherapy;Patient/family education;Passive range of motion;Manual techniques;Dry needling;Spinal Manipulations;Joint Manipulations    PT Next Visit Plan PROM    PT Home Exercise Plan see education    Consulted and Agree with Plan of Care Patient           Patient will benefit from skilled therapeutic intervention in order to improve the following deficits and impairments:  Decreased coordination,Decreased endurance,Decreased range of motion,Decreased strength,Hypomobility,Pain,Increased muscle spasms,Postural dysfunction,Impaired UE functional use,Decreased balance,Decreased mobility  Visit Diagnosis: Acute pain of left shoulder  Stiffness of left shoulder, not elsewhere classified     Problem List There are no problems to display for this patient.   , PT DPT 10/15/2020, 3:59 PM  [place] [facility] PHYSICAL AND SPORTS MEDICINE [address], [address], [place] Phone: [phone]   Fax:  [phone]  Name: Sara Castaneda MRN: Erick Alley Date of Birth: 1961-03-12

## 2020-10-17 ENCOUNTER — Ambulatory Visit: Payer: No Typology Code available for payment source

## 2020-10-17 ENCOUNTER — Other Ambulatory Visit: Payer: Self-pay

## 2020-10-17 DIAGNOSIS — M25612 Stiffness of left shoulder, not elsewhere classified: Secondary | ICD-10-CM

## 2020-10-17 DIAGNOSIS — M25512 Pain in left shoulder: Secondary | ICD-10-CM

## 2020-10-17 NOTE — Therapy (Signed)
[facility] PHYS[facility]TS MEDICINE [address], [place], [address] Phone: [phone]   Fax:  [phone]  Physical Therapy Treatment  Patient Details  Name: Sara Castaneda MRN: [id number] Date of Birth: 03/15/1961 Referring Provider (PT): felder MD   Encounter Date: 10/17/2020   PT End of Session - 10/17/20 0954    Visit Number 2    Number of Visits 17    Date for PT Re-Evaluation 01/07/21    Authorization Type WC    PT Start Time 0900    PT Stop Time 0945    PT Time Calculation (min) 45 min    Activity Tolerance Patient tolerated treatment well    Behavior During Therapy [facility] for tasks assessed/performed           Past Medical History:  Diagnosis Date  . COPD (chronic obstructive pulmonary disease) (HCC)     Past Surgical History:  Procedure Laterality Date  . CESAREAN SECTION    . HERNIA REPAIR      There were no vitals filed for this visit.   Subjective Assessment - 10/17/20 0952    Subjective Patient rpeorts she has been performing her exercises with little to no difficulty, states slight soreness but has significantly more AROM especially along her affected elbow.    Pertinent History LBP: Prior LBP and upper back pain along hip pain    Limitations Lifting    Diagnostic tests X-Ray shoulder - closed fx    Patient Stated Goals regain use of UE    Currently in Pain? Yes    Pain Score 3     Pain Location Shoulder    Pain Orientation Left    Pain Descriptors / Indicators Aching    Pain Type Acute pain    Pain Onset More than a month ago    Pain Frequency Constant           TREATMENT Therapeutic Exercise Performed in supine: PROM shoulder flexion - 2 x 20  PROM shoulder abduction- 2 x 20 PROM shoulder ER/IR - 2 x 20 Seated shoulder flexion pulleys - x 10 * stopped secondary to limitations in ROM* Table slides in sitting - x 10  Railing slide in standing at the stairs - x 10  Pendulums in standing -  x 45 sec Scapular retraction in supine - x 20 Elbow flexion in standing - x 10    Performed exercises to improve shoulder ROM     PT Education - 10/17/20 0954    Education Details HEP: Table slides up railing    Person(s) Educated Patient    Methods Explanation;Demonstration    Comprehension Verbalized understanding;Returned demonstration            PT Short Term Goals - 10/15/20 1544      PT SHORT TERM GOAL #1   Title Patient is will independent with low-level exercises to improve pain and decreased pain    Baseline dependent with HEP    Time 4    Period Weeks    Status New             PT Long Term Goals - 10/15/20 1546      PT LONG TERM GOAL #1   Title Patient will improve her AROM to >165 degrees of flexion and abduction to decrease pain and spasms in the L shoulder    Baseline PROM: 90    Time 6    Period Weeks    Status New  PT LONG TERM GOAL #2   Title Patient will improve be worst pain score from a 9/10 to a 4/10 to indicate significant improvement with pain and spasms along the shoulders.    Baseline 9/10    Time 6    Period Weeks    Status New      PT LONG TERM GOAL #3   Title Patient willl improve her FOTO score signficantly to indicate shoulder flexion improvment and allow for greater overhead movement.    Time 6    Period Weeks    Status New      PT LONG TERM GOAL #4   Title Patient will improve her shoulder ER/IR on the L side to decrease pain and spasms and allow for greater motion    Baseline shoulder ER/IR: 10/80    Time 6    Period Weeks    Status New                 Plan - 10/17/20 0954    Clinical Impression Statement Improvement in PROM today with ability to acheive ~120 of shoulder flexion, 100 of shoulder abduction, and ~40 degrees of ER indicating improvement in carryover between sessions. Although patient is improving, she continues to have limitations with performance of AAROM/PROM motions such as performing the  pulleys. Will continue to progress patient throughout further sessions to return to prior level of funciton.    Personal Factors and Comorbidities Comorbidity 2    Comorbidities Shoulder fx, LBP    Examination-Activity Limitations Lift;Carry    Examination-Participation Restrictions Cleaning;Occupation    Stability/Clinical Decision Making Stable/Uncomplicated    Rehab Potential Good    PT Frequency 2x / week    PT Duration 6 weeks    PT Treatment/Interventions Therapeutic exercise;Therapeutic activities;Electrical Stimulation;Iontophoresis 4mg /ml Dexamethasone;Neuromuscular re-education;Moist Heat;Stair training;Cryotherapy;Patient/family education;Passive range of motion;Manual techniques;Dry needling;Spinal Manipulations;Joint Manipulations    PT Next Visit Plan PROM    PT Home Exercise Plan see education    Consulted and Agree with Plan of Care Patient           Patient will benefit from skilled therapeutic intervention in order to improve the following deficits and impairments:  Decreased coordination,Decreased endurance,Decreased range of motion,Decreased strength,Hypomobility,Pain,Increased muscle spasms,Postural dysfunction,Impaired UE functional use,Decreased balance,Decreased mobility  Visit Diagnosis: Acute pain of left shoulder  Stiffness of left shoulder, not elsewhere classified     Problem List There are no problems to display for this patient.   , PT DPT 10/17/2020, 9:57 AM  [facility] PHYS[facility] AND SPORTS MEDICINE [address], [place] Phone: [phone]   Fax:  [phone]  Name: Sara Castaneda MRN: Erick Alley Date of Birth: [birth year]

## 2020-10-22 ENCOUNTER — Other Ambulatory Visit: Payer: Self-pay

## 2020-10-22 ENCOUNTER — Ambulatory Visit: Payer: No Typology Code available for payment source

## 2020-10-22 DIAGNOSIS — M25512 Pain in left shoulder: Secondary | ICD-10-CM

## 2020-10-22 DIAGNOSIS — M25612 Stiffness of left shoulder, not elsewhere classified: Secondary | ICD-10-CM

## 2020-10-23 NOTE — Therapy (Signed)
[place] [facility]TS [facility] [address], [place], [address] Phone: [phone]   Fax:  [phone]  Physical Therapy Treatment  Patient Details  Name: Sara Castaneda MRN: [id number] Date of Birth: Mar 18, 1961 Referring Provider (PT): felder MD   Encounter Date: 10/22/2020   PT End of Session - 10/23/20 0938    Visit Number 3    Number of Visits 17    Date for PT Re-Evaluation 01/07/21    Authorization Type WC    PT Start Time 0900    PT Stop Time 0945    PT Time Calculation (min) 45 min    Activity Tolerance Patient tolerated treatment well    Behavior During Therapy [facility] for tasks assessed/performed           Past Medical History:  Diagnosis Date  . COPD (chronic obstructive pulmonary disease) (HCC)     Past Surgical History:  Procedure Laterality Date  . CESAREAN SECTION    . HERNIA REPAIR      There were no vitals filed for this visit.   Subjective Assessment - 10/23/20 0936    Subjective Patient states no major changes since the previous session. States she's been able to perform exercises.    Pertinent History LBP: Prior LBP and upper back pain along hip pain    Limitations Lifting    Diagnostic tests X-Ray shoulder - closed fx    Patient Stated Goals regain use of UE    Currently in Pain? Yes    Pain Score 3     Pain Location Shoulder    Pain Orientation Left    Pain Descriptors / Indicators Aching    Pain Type Acute pain    Pain Onset More than a month ago            TREATMENT Therapeutic Exercise Performed in supine: PROM shoulder flexion -  x 20  PROM shoulder abduction-  x 20 PROM shoulder ER/IR -  x 20 Seated shoulder flexion pulleys - x 20 * Railing slide in standing at the stairs - x 10  Scapular retraction in supine - x 20 Elbow flexion in standing - x 10     Performed exercises to improve shoulder ROM    PT Education - 10/23/20 0938    Education Details form/technique with  exercise    Person(s) Educated Patient    Methods Explanation;Demonstration    Comprehension Verbalized understanding;Returned demonstration            PT Short Term Goals - 10/15/20 1544      PT SHORT TERM GOAL #1   Title Patient is will independent with low-level exercises to improve pain and decreased pain    Baseline dependent with HEP    Time 4    Period Weeks    Status New             PT Long Term Goals - 10/15/20 1546      PT LONG TERM GOAL #1   Title Patient will improve her AROM to >165 degrees of flexion and abduction to decrease pain and spasms in the L shoulder    Baseline PROM: 90    Time 6    Period Weeks    Status New      PT LONG TERM GOAL #2   Title Patient will improve be worst pain score from a 9/10 to a 4/10 to indicate significant improvement with pain and spasms along the shoulders.  Baseline 9/10    Time 6    Period Weeks    Status New      PT LONG TERM GOAL #3   Title Patient willl improve her FOTO score signficantly to indicate shoulder flexion improvment and allow for greater overhead movement.    Time 6    Period Weeks    Status New      PT LONG TERM GOAL #4   Title Patient will improve her shoulder ER/IR on the L side to decrease pain and spasms and allow for greater motion    Baseline shoulder ER/IR: 10/80    Time 6    Period Weeks    Status New                 Plan - 10/23/20 0939    Clinical Impression Statement Patient continues to improve in PROM to ~150 degree flexion, 110 abduction flexion, 65 degree of ER indicating functional improvement overall. Patient demonstrates improvement with use pulleys as well with ability to perform through greater AROM before onset of pain. Patient will benefit from further skilled therapy.    Personal Factors and Comorbidities Comorbidity 2    Comorbidities Shoulder fx, LBP    Examination-Activity Limitations Lift;Carry    Examination-Participation Restrictions Cleaning;Occupation     Stability/Clinical Decision Making Stable/Uncomplicated    Rehab Potential Good    PT Frequency 2x / week    PT Duration 6 weeks    PT Treatment/Interventions Therapeutic exercise;Therapeutic activities;Electrical Stimulation;Iontophoresis 4mg /ml Dexamethasone;Neuromuscular re-education;Moist Heat;Stair training;Cryotherapy;Patient/family education;Passive range of motion;Manual techniques;Dry needling;Spinal Manipulations;Joint Manipulations    PT Next Visit Plan PROM    PT Home Exercise Plan see education    Consulted and Agree with Plan of Care Patient           Patient will benefit from skilled therapeutic intervention in order to improve the following deficits and impairments:  Decreased coordination,Decreased endurance,Decreased range of motion,Decreased strength,Hypomobility,Pain,Increased muscle spasms,Postural dysfunction,Impaired UE functional use,Decreased balance,Decreased mobility  Visit Diagnosis: Acute pain of left shoulder  Stiffness of left shoulder, not elsewhere classified     Problem List There are no problems to display for this patient.   , PT DPT 10/23/2020, 9:55 AM  [place] [facility] SPORTS MEDICINE [address], [place] Phone: [phone]   Fax:  [phone]  Name: Sara Castaneda MRN: Erick Alley Date of Birth: 1961/03/11

## 2020-10-24 ENCOUNTER — Other Ambulatory Visit: Payer: Self-pay

## 2020-10-24 ENCOUNTER — Ambulatory Visit: Payer: No Typology Code available for payment source

## 2020-10-24 DIAGNOSIS — M25612 Stiffness of left shoulder, not elsewhere classified: Secondary | ICD-10-CM

## 2020-10-24 DIAGNOSIS — M25512 Pain in left shoulder: Secondary | ICD-10-CM

## 2020-10-24 NOTE — Therapy (Signed)
[place] [facility] PHYS[facility] SPORTS MEDICINE [address] [place], [address] Phone: [phone]   Fax:  [phone]  Physical Therapy Treatment  Patient Details  Name: Sara Castaneda MRN: [id number] Date of Birth: Dec 27, 1960 Referring Provider (PT): felder MD   Encounter Date: 10/24/2020   PT End of Session - 10/24/20 1701    Visit Number 4    Number of Visits 17    Date for PT Re-Evaluation 01/07/21    Authorization Type WC    PT Start Time 1115    PT Stop Time 1200    PT Time Calculation (min) 45 min    Activity Tolerance Patient tolerated treatment well    Behavior During Therapy [facility] for tasks assessed/performed           Past Medical History:  Diagnosis Date  . COPD (chronic obstructive pulmonary disease) (HCC)     Past Surgical History:  Procedure Laterality Date  . CESAREAN SECTION    . HERNIA REPAIR      There were no vitals filed for this visit.   Subjective Assessment - 10/24/20 1659    Subjective Patient states no increase in pain after the previous session. Reports her UE is sore.    Pertinent History LBP: Prior LBP and upper back pain along hip pain    Limitations Lifting    Diagnostic tests X-Ray shoulder - closed fx    Patient Stated Goals regain use of UE    Currently in Pain? Yes    Pain Score 3     Pain Location Shoulder    Pain Orientation Left    Pain Descriptors / Indicators Aching    Pain Type Acute pain    Pain Onset More than a month ago            TREATMENT Manual Therapy STM performed to retractors and thoracic extensors in sitting to decrease increased spasms and pain  Therapeutic Exercise Performed in supine: PROM shoulder flexion - x 20  PROM shoulder abduction- x 20 PROM shoulder ER/IR - x 20 Seated shoulder flexion pulleys -x 20  Railing slide in standing at the stairs -x 10  Scapular rows in supine -x 20   Performed exercises to improve shoulder ROM          PT Education - 10/24/20 1700    Education Details form/technique with exercise    Person(s) Educated Patient    Methods Explanation;Demonstration    Comprehension Verbalized understanding;Returned demonstration            PT Short Term Goals - 10/15/20 1544      PT SHORT TERM GOAL #1   Title Patient is will independent with low-level exercises to improve pain and decreased pain    Baseline dependent with HEP    Time 4    Period Weeks    Status New             PT Long Term Goals - 10/15/20 1546      PT LONG TERM GOAL #1   Title Patient will improve her AROM to >165 degrees of flexion and abduction to decrease pain and spasms in the L shoulder    Baseline PROM: 90    Time 6    Period Weeks    Status New      PT LONG TERM GOAL #2   Title Patient will improve be worst pain score from a 9/10 to a 4/10 to indicate significant improvement with  pain and spasms along the shoulders.    Baseline 9/10    Time 6    Period Weeks    Status New      PT LONG TERM GOAL #3   Title Patient willl improve her FOTO score signficantly to indicate shoulder flexion improvment and allow for greater overhead movement.    Time 6    Period Weeks    Status New      PT LONG TERM GOAL #4   Title Patient will improve her shoulder ER/IR on the L side to decrease pain and spasms and allow for greater motion    Baseline shoulder ER/IR: 10/80    Time 6    Period Weeks    Status New                 Plan - 10/24/20 1701    Clinical Impression Statement Patient demonstrates ~ full PROM on the affected shoulder for flexion and ER however continues to have around 65-70 deg of PROM in shoulder ER. Will continue to address this limitation with performing further exercises. No acute pain with shoulder pulleys on today's session and patient will benefit from further skilled therapy to return to prior level of function.    Personal Factors and Comorbidities Comorbidity 2    Comorbidities Shoulder  fx, LBP    Examination-Activity Limitations Lift;Carry    Examination-Participation Restrictions Cleaning;Occupation    Stability/Clinical Decision Making Stable/Uncomplicated    Rehab Potential Good    PT Frequency 2x / week    PT Duration 6 weeks    PT Treatment/Interventions Therapeutic exercise;Therapeutic activities;Electrical Stimulation;Iontophoresis 4mg /ml Dexamethasone;Neuromuscular re-education;Moist Heat;Stair training;Cryotherapy;Patient/family education;Passive range of motion;Manual techniques;Dry needling;Spinal Manipulations;Joint Manipulations    PT Next Visit Plan PROM    PT Home Exercise Plan see education    Consulted and Agree with Plan of Care Patient           Patient will benefit from skilled therapeutic intervention in order to improve the following deficits and impairments:  Decreased coordination,Decreased endurance,Decreased range of motion,Decreased strength,Hypomobility,Pain,Increased muscle spasms,Postural dysfunction,Impaired UE functional use,Decreased balance,Decreased mobility  Visit Diagnosis: Acute pain of left shoulder  Stiffness of left shoulder, not elsewhere classified     Problem List There are no problems to display for this patient.   , PT DPT 10/24/2020, 5:04 PM  [facility] PHYSICAL AND SPORTS MEDICINE [address], [address], [place] Phone: [phone]   Fax:  [phone]  Name: Sara Castaneda MRN: Erick Alley Date of Birth: Dec 03, 1960

## 2020-10-29 ENCOUNTER — Other Ambulatory Visit: Payer: Self-pay

## 2020-10-29 ENCOUNTER — Ambulatory Visit: Payer: No Typology Code available for payment source

## 2020-10-29 DIAGNOSIS — M25612 Stiffness of left shoulder, not elsewhere classified: Secondary | ICD-10-CM

## 2020-10-29 DIAGNOSIS — M25512 Pain in left shoulder: Secondary | ICD-10-CM

## 2020-10-29 NOTE — Therapy (Signed)
[place] [facility] PHYS[facility] MEDICINE [address], [place], [address] Phone: [phone]   Fax:  [phone]  Physical Therapy Treatment  Patient Details  Name: Sara Castaneda MRN: [id number] Date of Birth: 1960/12/05 Referring Provider (PT): felder MD   Encounter Date: 10/29/2020   PT End of Session - 10/29/20 0921    Visit Number 5    Number of Visits 17    Date for PT Re-Evaluation 01/07/21    Authorization Type WC    PT Start Time 0900    PT Stop Time 0945    PT Time Calculation (min) 45 min    Activity Tolerance Patient tolerated treatment well    Behavior During Therapy [facility] for tasks assessed/performed           Past Medical History:  Diagnosis Date  . COPD (chronic obstructive pulmonary disease) (HCC)     Past Surgical History:  Procedure Laterality Date  . CESAREAN SECTION    . HERNIA REPAIR      There were no vitals filed for this visit.   Subjective Assessment - 10/29/20 0906    Subjective Patient states increased stiffness along the L UE today. ued the pulley at home x 2; no major issues noted.    Pertinent History LBP: Prior LBP and upper back pain along hip pain    Limitations Lifting    Diagnostic tests X-Ray shoulder - closed fx    Patient Stated Goals regain use of UE    Currently in Pain? No/denies    Pain Onset More than a month ago             TREATMENT   Therapeutic Exercise Performed in supine: PROM shoulder flexion -  x 20  PROM shoulder abduction-  x 20 PROM shoulder ER/IR -  x 20 Supine Scapular protraction with dowel - 2 x 10 Supine shoulder flexion with dowel AAROM - 2 x 10  Seated thoracic extension over towel on chair - 2 x 10  Standing scapular retraction rows in standing - 2 x 10      Performed exercises to improve shoulder ROM      PT Education - 10/29/20 0920    Education Details form/technique with exercise    Person(s) Educated Patient    Methods  Explanation;Demonstration    Comprehension Verbalized understanding;Returned demonstration            PT Short Term Goals - 10/15/20 1544      PT SHORT TERM GOAL #1   Title Patient is will independent with low-level exercises to improve pain and decreased pain    Baseline dependent with HEP    Time 4    Period Weeks    Status New             PT Long Term Goals - 10/15/20 1546      PT LONG TERM GOAL #1   Title Patient will improve her AROM to >165 degrees of flexion and abduction to decrease pain and spasms in the L shoulder    Baseline PROM: 90    Time 6    Period Weeks    Status New      PT LONG TERM GOAL #2   Title Patient will improve be worst pain score from a 9/10 to a 4/10 to indicate significant improvement with pain and spasms along the shoulders.    Baseline 9/10    Time 6    Period Weeks  Status New      PT LONG TERM GOAL #3   Title Patient willl improve her FOTO score signficantly to indicate shoulder flexion improvment and allow for greater overhead movement.    Time 6    Period Weeks    Status New      PT LONG TERM GOAL #4   Title Patient will improve her shoulder ER/IR on the L side to decrease pain and spasms and allow for greater motion    Baseline shoulder ER/IR: 10/80    Time 6    Period Weeks    Status New                 Plan - 10/29/20 0929    Clinical Impression Statement Began to incorporate greater AAROM exercises today to begin improving strengthening seocndary to patient havign full PROM in flexion and ER/IR. Fatigues quickly with performance, however able to tolerate greater amount of movement. Patient will benefit from further skilled therapy to return to prior level of function.    Personal Factors and Comorbidities Comorbidity 2    Comorbidities Shoulder fx, LBP    Examination-Activity Limitations Lift;Carry    Examination-Participation Restrictions Cleaning;Occupation    Stability/Clinical Decision Making  Stable/Uncomplicated    Rehab Potential Good    PT Frequency 2x / week    PT Duration 6 weeks    PT Treatment/Interventions Therapeutic exercise;Therapeutic activities;Electrical Stimulation;Iontophoresis 4mg /ml Dexamethasone;Neuromuscular re-education;Moist Heat;Stair training;Cryotherapy;Patient/family education;Passive range of motion;Manual techniques;Dry needling;Spinal Manipulations;Joint Manipulations    PT Next Visit Plan PROM    PT Home Exercise Plan see education    Consulted and Agree with Plan of Care Patient           Patient will benefit from skilled therapeutic intervention in order to improve the following deficits and impairments:  Decreased coordination,Decreased endurance,Decreased range of motion,Decreased strength,Hypomobility,Pain,Increased muscle spasms,Postural dysfunction,Impaired UE functional use,Decreased balance,Decreased mobility  Visit Diagnosis: Acute pain of left shoulder  Stiffness of left shoulder, not elsewhere classified     Problem List There are no problems to display for this patient.   , PT DPT 10/29/2020, 9:42 AM  [place] [facility] PHYSICAL AND SPORTS MEDICINE [address], [place] Phone: [phone]   Fax:  [phone]  Name: Sara Castaneda MRN: Erick Alley Date of Birth: 12/11/1960

## 2020-10-31 ENCOUNTER — Other Ambulatory Visit: Payer: Self-pay

## 2020-10-31 ENCOUNTER — Ambulatory Visit: Payer: No Typology Code available for payment source

## 2020-10-31 DIAGNOSIS — M25612 Stiffness of left shoulder, not elsewhere classified: Secondary | ICD-10-CM

## 2020-10-31 DIAGNOSIS — M25512 Pain in left shoulder: Secondary | ICD-10-CM

## 2020-10-31 NOTE — Therapy (Signed)
[place] [facility] PHYS[facility]TS MEDICINE [address], [place], [address] Phone: [phone]   Fax:  [phone]  Physical Therapy Treatment  Patient Details  Name: ALEXANDR OEHLER MRN: [id number] Date of Birth: March 26, 1961 Referring Provider (PT): felder MD   Encounter Date: 10/31/2020   PT End of Session - 10/31/20 0916    Visit Number 6    Number of Visits 17    Date for PT Re-Evaluation 01/07/21    Authorization Type WC    PT Start Time 0900    PT Stop Time 0945    PT Time Calculation (min) 45 min    Activity Tolerance Patient tolerated treatment well    Behavior During Therapy [facility] for tasks assessed/performed           Past Medical History:  Diagnosis Date  . COPD (chronic obstructive pulmonary disease) (HCC)     Past Surgical History:  Procedure Laterality Date  . CESAREAN SECTION    . HERNIA REPAIR      There were no vitals filed for this visit.   Subjective Assessment - 10/31/20 0914    Subjective Patient reports she has been performing her exercises at home. Patient states she has been feeling sore in the mornings.    Pertinent History LBP: Prior LBP and upper back pain along hip pain    Limitations Lifting    Diagnostic tests X-Ray shoulder - closed fx    Patient Stated Goals regain use of UE    Currently in Pain? No/denies    Pain Onset More than a month ago              Therapeutic Exercise Performed in supine: PROM shoulder flexion -  x 20  PROM shoulder abduction-  x 20 PROM shoulder ER/IR -  x 20 Chest press in supine - 2 x 10 with dowel Supine shoulder flexion with dowel AAROM - 2 x 10  Shoulder isometrics ER/IR standing in doorframe - 60sec x 2 in each direction Standing scapular retraction rows in standing - 2 x 10  Pulleys in sitting flexion - x 3 min     Performed exercises to improve shoulder ROM   PT Education - 10/31/20 0915    Education Details form/technique with exercise     Person(s) Educated Patient    Methods Explanation;Demonstration    Comprehension Verbalized understanding;Returned demonstration            PT Short Term Goals - 10/15/20 1544      PT SHORT TERM GOAL #1   Title Patient is will independent with low-level exercises to improve pain and decreased pain    Baseline dependent with HEP    Time 4    Period Weeks    Status New             PT Long Term Goals - 10/15/20 1546      PT LONG TERM GOAL #1   Title Patient will improve her AROM to >165 degrees of flexion and abduction to decrease pain and spasms in the L shoulder    Baseline PROM: 90    Time 6    Period Weeks    Status New      PT LONG TERM GOAL #2   Title Patient will improve be worst pain score from a 9/10 to a 4/10 to indicate significant improvement with pain and spasms along the shoulders.    Baseline 9/10    Time 6  Period Weeks    Status New      PT LONG TERM GOAL #3   Title Patient willl improve her FOTO score signficantly to indicate shoulder flexion improvment and allow for greater overhead movement.    Time 6    Period Weeks    Status New      PT LONG TERM GOAL #4   Title Patient will improve her shoulder ER/IR on the L side to decrease pain and spasms and allow for greater motion    Baseline shoulder ER/IR: 10/80    Time 6    Period Weeks    Status New                 Plan - 10/31/20 0931    Clinical Impression Statement Conitnued to progress AAROM exercises and introduced isometrics during today session. Tolerates this well today without any increase in pain. Patient continues to fatigue quickly but able to go through greater AAROM. Patient will benefit from further skilled therapy to return to prior level of function.    Personal Factors and Comorbidities Comorbidity 2    Comorbidities Shoulder fx, LBP    Examination-Activity Limitations Lift;Carry    Examination-Participation Restrictions Cleaning;Occupation    Stability/Clinical  Decision Making Stable/Uncomplicated    Rehab Potential Good    PT Frequency 2x / week    PT Duration 6 weeks    PT Treatment/Interventions Therapeutic exercise;Therapeutic activities;Electrical Stimulation;Iontophoresis 4mg /ml Dexamethasone;Neuromuscular re-education;Moist Heat;Stair training;Cryotherapy;Patient/family education;Passive range of motion;Manual techniques;Dry needling;Spinal Manipulations;Joint Manipulations    PT Next Visit Plan PROM    PT Home Exercise Plan see education    Consulted and Agree with Plan of Care Patient           Patient will benefit from skilled therapeutic intervention in order to improve the following deficits and impairments:  Decreased coordination,Decreased endurance,Decreased range of motion,Decreased strength,Hypomobility,Pain,Increased muscle spasms,Postural dysfunction,Impaired UE functional use,Decreased balance,Decreased mobility  Visit Diagnosis: Acute pain of left shoulder  Stiffness of left shoulder, not elsewhere classified     Problem List There are no problems to display for this patient.   , PT DPT 10/31/2020, 9:41 AM  [place] [facility] PHYSICAL AND SPORTS MEDICINE [address], [place] Phone: [phone]   Fax:  [phone]  Name: DEVLYNN KNOFF MRN: Erick Alley Date of Birth: 1960/10/01

## 2020-11-05 ENCOUNTER — Ambulatory Visit: Payer: No Typology Code available for payment source

## 2020-11-05 ENCOUNTER — Other Ambulatory Visit: Payer: Self-pay

## 2020-11-05 DIAGNOSIS — M25612 Stiffness of left shoulder, not elsewhere classified: Secondary | ICD-10-CM

## 2020-11-05 DIAGNOSIS — M25512 Pain in left shoulder: Secondary | ICD-10-CM

## 2020-11-05 NOTE — Therapy (Signed)
[place] [facility] PHYSICAL [facility] SPORTS MEDICINE [address], [place], [address] Phone: [phone]   Fax:  [phone]  Physical Therapy Treatment  Patient Details  Name: Sara Castaneda MRN: [id number] Date of Birth: 1960/12/15 Referring Provider (PT): felder MD   Encounter Date: 11/05/2020   PT End of Session - 11/05/20 1702    Visit Number 7    Number of Visits 17    Date for PT Re-Evaluation 01/07/21    Authorization Type WC    PT Start Time 1300    PT Stop Time 1345    PT Time Calculation (min) 45 min    Activity Tolerance Patient tolerated treatment well    Behavior During Therapy [facility] for tasks assessed/performed           Past Medical History:  Diagnosis Date  . COPD (chronic obstructive pulmonary disease) (HCC)     Past Surgical History:  Procedure Laterality Date  . CESAREAN SECTION    . HERNIA REPAIR      There were no vitals filed for this visit.   Subjective Assessment - 11/05/20 1325    Subjective Patient reports she has been feeling increased soreness along the L UE; however states she went to her physician's office who states her arm has start to form calcification around the healing site.    Pertinent History LBP: Prior LBP and upper back pain along hip pain    Limitations Lifting    Diagnostic tests X-Ray shoulder - closed fx    Patient Stated Goals regain use of UE    Currently in Pain? No/denies    Pain Onset More than a month ago             Therapeutic Exercise Performed in supine: AAROM shoulder flexion in supine - 2 x 20 with dowel 90-120 degree of flexion  AAROM shoulder flexion in supine -2 x 15 with dowel 0-90 degree of flexion  Chest press in supine - 2 x 10 with dowel Arms at 90 in supine UE twists - with dowel - x 30 AROM Shoulder ER/IR - x 20  Performed exercises to improve shoulder ROM   Manual Therapy STM performed to the middle deltoid along the L UE to improve motion and decrease  pain and spasms along the affected side.  While performing STM, TrP dry needling utilizing 31mm x .3 mm with patient positioned in supine to decrease pain and spasms     PT Education - 11/05/20 1336    Education Details form/technique with exercise    Person(s) Educated Patient    Methods Explanation;Demonstration    Comprehension Verbalized understanding;Returned demonstration            PT Short Term Goals - 10/15/20 1544      PT SHORT TERM GOAL #1   Title Patient is will independent with low-level exercises to improve pain and decreased pain    Baseline dependent with HEP    Time 4    Period Weeks    Status New             PT Long Term Goals - 10/15/20 1546      PT LONG TERM GOAL #1   Title Patient will improve her AROM to >165 degrees of flexion and abduction to decrease pain and spasms in the L shoulder    Baseline PROM: 90    Time 6    Period Weeks    Status New  PT LONG TERM GOAL #2   Title Patient will improve be worst pain score from a 9/10 to a 4/10 to indicate significant improvement with pain and spasms along the shoulders.    Baseline 9/10    Time 6    Period Weeks    Status New      PT LONG TERM GOAL #3   Title Patient willl improve her FOTO score signficantly to indicate shoulder flexion improvment and allow for greater overhead movement.    Time 6    Period Weeks    Status New      PT LONG TERM GOAL #4   Title Patient will improve her shoulder ER/IR on the L side to decrease pain and spasms and allow for greater motion    Baseline shoulder ER/IR: 10/80    Time 6    Period Weeks    Status New                 Plan - 11/05/20 1656    Clinical Impression Statement Able to tolerate greater AAROM performance today as she is able to perform overhead movement from 0 degrees without assistance from therapist and throughout full AROM. Performed dry needling during today's session to decrease deltoid limitations, patient tolerates well with  light soreness after performance. Patient will benefit from further skilled therapy focused on improving limitations.    Personal Factors and Comorbidities Comorbidity 2    Comorbidities Shoulder fx, LBP    Examination-Activity Limitations Lift;Carry    Examination-Participation Restrictions Cleaning;Occupation    Stability/Clinical Decision Making Stable/Uncomplicated    Rehab Potential Good    PT Frequency 2x / week    PT Duration 6 weeks    PT Treatment/Interventions Therapeutic exercise;Therapeutic activities;Electrical Stimulation;Iontophoresis 4mg /ml Dexamethasone;Neuromuscular re-education;Moist Heat;Stair training;Cryotherapy;Patient/family education;Passive range of motion;Manual techniques;Dry needling;Spinal Manipulations;Joint Manipulations    PT Next Visit Plan PROM    PT Home Exercise Plan see education    Consulted and Agree with Plan of Care Patient           Patient will benefit from skilled therapeutic intervention in order to improve the following deficits and impairments:  Decreased coordination,Decreased endurance,Decreased range of motion,Decreased strength,Hypomobility,Pain,Increased muscle spasms,Postural dysfunction,Impaired UE functional use,Decreased balance,Decreased mobility  Visit Diagnosis: Acute pain of left shoulder  Stiffness of left shoulder, not elsewhere classified     Problem List There are no problems to display for this patient.   , PT DPT 11/05/2020, 5:03 PM  [place] [facility] PHYSICAL AND SPORTS MEDICINE [address], [place] Phone: [phone]   Fax:  [phone]  Name: Sara Castaneda MRN: Erick Alley Date of Birth: 04/11/1961

## 2020-11-08 ENCOUNTER — Ambulatory Visit: Payer: No Typology Code available for payment source

## 2020-11-08 ENCOUNTER — Other Ambulatory Visit: Payer: Self-pay

## 2020-11-08 DIAGNOSIS — M25612 Stiffness of left shoulder, not elsewhere classified: Secondary | ICD-10-CM

## 2020-11-08 DIAGNOSIS — M25512 Pain in left shoulder: Secondary | ICD-10-CM

## 2020-11-08 NOTE — Therapy (Signed)
[place] [facility] PHYSICAL [facility] SPORTS MEDICINE [address], [place], [address] Phone: [phone]   Fax:  [phone]  Physical Therapy Treatment  Patient Details  Name: Sara Castaneda MRN: [id number] Date of Birth: 18-Jan-1961 Referring Provider (PT): felder MD   Encounter Date: 11/08/2020   PT End of Session - 11/08/20 1519    Visit Number 8    Number of Visits 17    Date for PT Re-Evaluation 01/07/21    Authorization Type WC    PT Start Time 1425    PT Stop Time 1510    PT Time Calculation (min) 45 min    Activity Tolerance Patient tolerated treatment well    Behavior During Therapy [facility] for tasks assessed/performed           Past Medical History:  Diagnosis Date  . COPD (chronic obstructive pulmonary disease) (HCC)     Past Surgical History:  Procedure Laterality Date  . CESAREAN SECTION    . HERNIA REPAIR      There were no vitals filed for this visit.   Subjective Assessment - 11/08/20 1516    Subjective Patient reports she has been feeling increased soreness and tenderness along the L UE; however states that she feels as though her muscle is more relaxed after being dry needled during her last session.    Pertinent History LBP: Prior LBP and upper back pain along hip pain    Limitations Lifting    Diagnostic tests X-Ray shoulder - closed fx    Patient Stated Goals regain use of UE    Currently in Pain? No/denies    Pain Onset More than a month ago           TREATMENT  Therapeutic Exercise Performed in supine: AAROM shoulder flexion in supine - 2 x 20 with dowel 90-120 degree of flexion  AAROM shoulder flexion in supine -2 x 15 with dowel 0-90 degree of flexion  Chest press in supine - 2 x 10 with dowel Arms at 90 in supine UE twists - with dowel - x 30 AROM Shoulder ER/IR in sitting - 2 x 15 PROM shoulder flexion x 15  PROM shoulder abduction x 15  Rhythmic stabilization at 110 degree of flexion 2 x 15   Resisted isometrics shoulder ER/IR in standing 2 x 10   Standing shoulder retractions w/ YTB 2 x 10    Performed exercises to improve shoulder ROM    Manual Therapy STM performed to the middle deltoid along the L UE to improve motion and decrease pain and spasms along the affected side.   While performing STM, Biceps dry needling utilizing 80mm x .3 mm with patient positioned in supine to decrease pain and spasms        PT Education - 11/08/20 1518    Education Details form/technique with exercise    Person(s) Educated Patient    Methods Explanation;Demonstration    Comprehension Verbalized understanding;Returned demonstration            PT Short Term Goals - 10/15/20 1544      PT SHORT TERM GOAL #1   Title Patient is will independent with low-level exercises to improve pain and decreased pain    Baseline dependent with HEP    Time 4    Period Weeks    Status New             PT Long Term Goals - 10/15/20 1546  PT LONG TERM GOAL #1   Title Patient will improve her AROM to >165 degrees of flexion and abduction to decrease pain and spasms in the L shoulder    Baseline PROM: 90    Time 6    Period Weeks    Status New      PT LONG TERM GOAL #2   Title Patient will improve be worst pain score from a 9/10 to a 4/10 to indicate significant improvement with pain and spasms along the shoulders.    Baseline 9/10    Time 6    Period Weeks    Status New      PT LONG TERM GOAL #3   Title Patient willl improve her FOTO score signficantly to indicate shoulder flexion improvment and allow for greater overhead movement.    Time 6    Period Weeks    Status New      PT LONG TERM GOAL #4   Title Patient will improve her shoulder ER/IR on the L side to decrease pain and spasms and allow for greater motion    Baseline shoulder ER/IR: 10/80    Time 6    Period Weeks    Status New                 Plan - 11/08/20 1519    Clinical Impression Statement Patient  demonstrates an increase in muscle activation and endurance while performing shoulder ER/IR resisted isometreics. Dry needling was performed during today's session to decrease bicep limitations, patient tolerates well with light soreness after performance. Patient still has some increased pain during PROM shoulder abduction before end range. Patient will benefit from further skilled therapy to improve limitations and functional capacity.    Personal Factors and Comorbidities Comorbidity 2    Comorbidities Shoulder fx, LBP    Examination-Activity Limitations Lift;Carry    Examination-Participation Restrictions Cleaning;Occupation    Stability/Clinical Decision Making Stable/Uncomplicated    Rehab Potential Good    PT Frequency 2x / week    PT Duration 6 weeks    PT Treatment/Interventions Therapeutic exercise;Therapeutic activities;Electrical Stimulation;Iontophoresis 4mg /ml Dexamethasone;Neuromuscular re-education;Moist Heat;Stair training;Cryotherapy;Patient/family education;Passive range of motion;Manual techniques;Dry needling;Spinal Manipulations;Joint Manipulations    PT Next Visit Plan PROM    PT Home Exercise Plan see education    Consulted and Agree with Plan of Care Patient           Patient will benefit from skilled therapeutic intervention in order to improve the following deficits and impairments:  Decreased coordination,Decreased endurance,Decreased range of motion,Decreased strength,Hypomobility,Pain,Increased muscle spasms,Postural dysfunction,Impaired UE functional use,Decreased balance,Decreased mobility  Visit Diagnosis: Acute pain of left shoulder  Stiffness of left shoulder, not elsewhere classified     Problem List There are no problems to display for this patient.   , SPT 11/08/2020, 3:28 PM  [facility] PHYSICAL AND SPORTS MEDICINE [address], [address], [place] Phone: [phone]   Fax:   [phone]  Name: Sara Castaneda MRN: Erick Alley Date of Birth: [birth year]

## 2020-11-12 ENCOUNTER — Other Ambulatory Visit: Payer: Self-pay

## 2020-11-12 ENCOUNTER — Ambulatory Visit: Payer: No Typology Code available for payment source | Attending: Sports Medicine

## 2020-11-12 DIAGNOSIS — M25512 Pain in left shoulder: Secondary | ICD-10-CM | POA: Diagnosis not present

## 2020-11-12 DIAGNOSIS — M25612 Stiffness of left shoulder, not elsewhere classified: Secondary | ICD-10-CM

## 2020-11-12 NOTE — Therapy (Signed)
[facility] [address] [place], [address] Phone: [phone]   Fax:  [phone]  Physical Therapy Treatment  Patient Details  Name: Sara Castaneda MRN: [id number] Date of Birth: 1961/02/12 Referring Provider (PT): felder MD   Encounter Date: 11/12/2020   PT End of Session - 11/12/20 1607    Visit Number 9    Number of Visits 17    Date for PT Re-Evaluation 01/07/21    Authorization Type WC    PT Start Time 1515    PT Stop Time 1557    PT Time Calculation (min) 42 min           Past Medical History:  Diagnosis Date  . COPD (chronic obstructive pulmonary disease) (HCC)     Past Surgical History:  Procedure Laterality Date  . CESAREAN SECTION    . HERNIA REPAIR      There were no vitals filed for this visit.   Subjective Assessment - 11/12/20 1603    Subjective Patient reports an increase in mobility in L shoulder. Patient states that she has some increased pain when she leans into a sitting position.Patient states that she still feels soreness in her deltoid muscles.    Pertinent History LBP: Prior LBP and upper back pain along hip pain    Limitations Lifting    Diagnostic tests X-Ray shoulder - closed fx    Patient Stated Goals regain use of UE    Currently in Pain? Yes    Pain Score 3     Pain Location Arm    Pain Orientation Right    Pain Descriptors / Indicators Aching    Pain Type Acute pain    Pain Onset More than a month ago             TREATMENT  Therapeutic Exercise Performed in supine: AAROM shoulder flexion in supine - 2 x 20 with dowel 90-120 degree of flexion  AAROM shoulder flexion in supine -2 x 15 with dowel 0-90 degree of flexion  Chest press in supine - 2 x 10 with dowel Arms at 90 in supine UE twists - with dowel - x 30 AROM Shoulder ER/IR in sitting w/ YTB - 3 x 10  PROM shoulder flexion x 15  PROM shoulder abduction x 15  Rhythmic stabilization at 90  degree of shoulder flexion 2 x 15  Resisted isometrics shoulder ER/IR in standing 2 x 10   Standing shoulder retractions w/ YTB 2 x 15  Rolling ball on wall in standing    Performed exercises to improve shoulder ROM      PT Education - 11/12/20 1606    Education Details form/technique with exercise    Person(s) Educated Patient    Methods Demonstration;Explanation    Comprehension Verbalized understanding;Returned demonstration            PT Short Term Goals - 10/15/20 1544      PT SHORT TERM GOAL #1   Title Patient is will independent with low-level exercises to improve pain and decreased pain    Baseline dependent with HEP    Time 4    Period Weeks    Status New             PT Long Term Goals - 10/15/20 1546      PT LONG TERM GOAL #1   Title Patient will improve her AROM to >165 degrees of flexion and abduction to decrease pain and  spasms in the L shoulder    Baseline PROM: 90    Time 6    Period Weeks    Status New      PT LONG TERM GOAL #2   Title Patient will improve be worst pain score from a 9/10 to a 4/10 to indicate significant improvement with pain and spasms along the shoulders.    Baseline 9/10    Time 6    Period Weeks    Status New      PT LONG TERM GOAL #3   Title Patient willl improve her FOTO score signficantly to indicate shoulder flexion improvment and allow for greater overhead movement.    Time 6    Period Weeks    Status New      PT LONG TERM GOAL #4   Title Patient will improve her shoulder ER/IR on the L side to decrease pain and spasms and allow for greater motion    Baseline shoulder ER/IR: 10/80    Time 6    Period Weeks    Status New                 Plan - 11/12/20 1608    Clinical Impression Statement Patient demonstrates a continued improvement in performing AAROM exercises with minimal pain. Patient still has limitations in shoulder abduction and flexion PROM, secondary to pain and muscle weakness. Patient will  benefit from skilled therapy to return to prior level of function.    Personal Factors and Comorbidities Comorbidity 2    Comorbidities Shoulder fx, LBP    Examination-Activity Limitations Lift;Carry    Examination-Participation Restrictions Cleaning;Occupation    Stability/Clinical Decision Making Stable/Uncomplicated    Rehab Potential Good    PT Frequency 2x / week    PT Duration 6 weeks    PT Treatment/Interventions Therapeutic exercise;Therapeutic activities;Electrical Stimulation;Iontophoresis 4mg /ml Dexamethasone;Neuromuscular re-education;Moist Heat;Stair training;Cryotherapy;Patient/family education;Passive range of motion;Manual techniques;Dry needling;Spinal Manipulations;Joint Manipulations    PT Next Visit Plan PROM    PT Home Exercise Plan see education    Consulted and Agree with Plan of Care Patient           Patient will benefit from skilled therapeutic intervention in order to improve the following deficits and impairments:  Decreased coordination,Decreased endurance,Decreased range of motion,Decreased strength,Hypomobility,Pain,Increased muscle spasms,Postural dysfunction,Impaired UE functional use,Decreased balance,Decreased mobility  Visit Diagnosis: Acute pain of left shoulder  Stiffness of left shoulder, not elsewhere classified     Problem List There are no problems to display for this patient.   , SPT 11/12/2020, 4:16 PM  [facility] [address], [address], [place] Phone: [phone]   Fax:  [phone]  Name: Sara Castaneda MRN: [id number] Date of Birth: 1961-02-10
